# Patient Record
Sex: Male | Born: 1955 | Race: White | Hispanic: No | Marital: Married | Smoking: Former smoker
Health system: Southern US, Community
[De-identification: ages and names within clinical notes are randomized; demographics above are authoritative.]

## PROBLEM LIST (undated history)

## (undated) DIAGNOSIS — I219 Acute myocardial infarction, unspecified: Secondary | ICD-10-CM

## (undated) DIAGNOSIS — Z87442 Personal history of urinary calculi: Secondary | ICD-10-CM

## (undated) DIAGNOSIS — C801 Malignant (primary) neoplasm, unspecified: Secondary | ICD-10-CM

## (undated) DIAGNOSIS — R748 Abnormal levels of other serum enzymes: Secondary | ICD-10-CM

## (undated) DIAGNOSIS — K219 Gastro-esophageal reflux disease without esophagitis: Secondary | ICD-10-CM

## (undated) DIAGNOSIS — Z8719 Personal history of other diseases of the digestive system: Secondary | ICD-10-CM

## (undated) DIAGNOSIS — J449 Chronic obstructive pulmonary disease, unspecified: Secondary | ICD-10-CM

## (undated) DIAGNOSIS — E559 Vitamin D deficiency, unspecified: Secondary | ICD-10-CM

## (undated) DIAGNOSIS — I10 Essential (primary) hypertension: Secondary | ICD-10-CM

## (undated) DIAGNOSIS — E669 Obesity, unspecified: Secondary | ICD-10-CM

## (undated) DIAGNOSIS — F419 Anxiety disorder, unspecified: Secondary | ICD-10-CM

## (undated) HISTORY — PX: NO PAST SURGERIES: SHX2092

---
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
## 2003-11-09 ENCOUNTER — Ambulatory Visit (HOSPITAL_COMMUNITY): Admission: RE | Admit: 2003-11-09 | Discharge: 2003-11-09 | Payer: Self-pay | Admitting: Family Medicine

## 2005-09-04 ENCOUNTER — Emergency Department (HOSPITAL_COMMUNITY): Admission: EM | Admit: 2005-09-04 | Discharge: 2005-09-04 | Payer: Self-pay | Admitting: Emergency Medicine

## 2005-09-04 ENCOUNTER — Encounter: Payer: Self-pay | Admitting: Internal Medicine

## 2008-03-08 ENCOUNTER — Ambulatory Visit: Payer: Self-pay | Admitting: Cardiology

## 2008-06-05 ENCOUNTER — Emergency Department (HOSPITAL_COMMUNITY): Admission: EM | Admit: 2008-06-05 | Discharge: 2008-06-05 | Payer: Self-pay | Admitting: Emergency Medicine

## 2010-05-31 ENCOUNTER — Ambulatory Visit (HOSPITAL_COMMUNITY)
Admission: RE | Admit: 2010-05-31 | Discharge: 2010-05-31 | Disposition: A | Discharge status: Home / Self Care | Payer: BC Managed Care – PPO | Source: Ambulatory Visit | Attending: Family Medicine | Admitting: Family Medicine

## 2010-05-31 ENCOUNTER — Other Ambulatory Visit (HOSPITAL_COMMUNITY): Payer: Self-pay | Admitting: Family Medicine

## 2010-05-31 DIAGNOSIS — T148XXA Other injury of unspecified body region, initial encounter: Secondary | ICD-10-CM

## 2010-05-31 DIAGNOSIS — R0789 Other chest pain: Secondary | ICD-10-CM

## 2010-05-31 DIAGNOSIS — S20219A Contusion of unspecified front wall of thorax, initial encounter: Secondary | ICD-10-CM | POA: Insufficient documentation

## 2010-05-31 DIAGNOSIS — X58XXXA Exposure to other specified factors, initial encounter: Secondary | ICD-10-CM | POA: Insufficient documentation

## 2010-06-02 NOTE — Consult Note (Signed)
NAME:  Harry Riley, Harry Riley NO.:  [id number]   MEDICAL RECORD NO.:  [id number]          PATIENT TYPE:  EMS   LOCATION:  [facility]                         FACILITY:  [facility]   PHYSICIAN:  Marcelo Baldy, DDS, MDDATE OF BIRTH:  [birth year]   DATE OF CONSULTATION:  09/04/2005  DATE OF DISCHARGE:  09/04/2005                                   CONSULTATION   The patient presented to the emergency room complaining of pain and swelling  in his right anterior mandible, which has been present for approximately 3  days.   HISTORY OF PRESENT ILLNESS:  Patient is a 55 year old male who presented to  his primary care physician at [facility] this morning, Dr. Margo Aye, with the  same complaints.  Dr. Margo Aye obtained a CT scan of the face and basic labs and  called our office describing a facial abscess with thrombocytopenia, so we  recommended the patient be transferred down to [facility] for  evaluation.   PAST MEDICAL HISTORY:  Unremarkable.   PAST SURGICAL HISTORY:  None.   MEDICATIONS:  None.   SOCIAL HISTORY:  He takes no drugs, does not drink alcohol, and does not  smoke.   ALLERGIES:  PENICILLIN.   PHYSICAL EXAMINATION:  GENERAL:  The patient is awake, alert, and oriented  x3.  He has gross swelling of the anterior mandible, which is soft and  slightly erythematous and slightly warm to touch.  Intraoral exam revealed  multiple carious teeth, including teeth numbers 22 through 27, which are  severely decayed.  He has a right anterior mandibular vestibular fullness,  which is fluctuant and tender to palpation.  He has poor oral hygiene, no  gross lymphadenopathy.  VITAL SIGNS:  Temperature 98 degrees, pulse 110, respirations 20, blood  pressure 124/80.   The lab results from [facility] showed a white blood cell count of 13.1,  hemoglobin of 17.2, hematocrit of 48.3, and a platelet count of 69,000.  His  basic metabolic profile:  Sodium 133, potassium 4.2, chloride  99, bicarb 22,  glucose 108, BUN 9, creatinine 0.9, and calcium 9.5.  His CT scan findings  were that he had multiple dental caries.  He also had a finding of  periapical abscess involving the right lateral mandibular incisor, or tooth  #26.  The results are a soft tissue abscess measuring 1.7 x 0.8 cm  transversely and 1.8 cm in diameter.  There was also generalized  premandibular cellulitis.   ASSESSMENT/PLAN:  The patient is a 55 year old gentleman with a right  anterior mandibular odontogenic abscess.  The plan is to do an incision and  drainage and extraction of necessary teeth, if possible, with IV sedation in  the emergency room and to discharge the patient home on oral antibiotics,  and have the patient follow up in our clinic on Thursday.  The patient will  also be instructed to follow up with his primary care physician for further  workup of this thrombocytopenia.      Marcelo Baldy, DDS, MD  Electronically Signed     TGB/MEDQ  D:  09/04/2005  T:  09/05/2005  Job:  [id number]

## 2010-06-02 NOTE — Consult Note (Signed)
NAME:  Harry Riley, Harry Riley NO.:  [id number]   MEDICAL RECORD NO.:  [id number]          PATIENT TYPE:  EMS   LOCATION:  MAJO                         FACILITY:  [facility]   PHYSICIAN:  Marcelo Baldy, DDS, MDDATE OF BIRTH:  Mar 11, 1955   DATE OF CONSULTATION:  DATE OF DISCHARGE:  09/04/2005                                   CONSULTATION   PROCEDURE NOTE:   PROCEDURE:  Incision and drainage of right anterior mandibular and submental  space abscess.   PROCEDURE IN DETAIL:  After the patient was adequately sedated with  intravenous medications, approximately 4 mL of 2% lidocaine with 100,000 of  epinephrine was infiltrated locally in the right anterior mandibular  vestibule and submental areas.  A #11 blade was then used to create an  incision in the right anterior mandibular vestibule to enter the abscess  cavity.  Approximately 3 mL of purulent exudate was obtained.  Subperiosteal  dissection was carried out inferiorly to the inferior border of the mandible  laterally and medially.  The area was copiously irrigated.  A Penrose drain  was then placed and secured into position using a 3-0 nylon suture.  The  patient, at this point, was allowed to awaken from sedation.  Gauze pack was  placed for hemostasis and hemostasis was verified prior to discharging the  patient home.  The patient tolerated the procedure well.  The patient was  instructed to call the office tomorrow morning to schedule a followup visit  for drain removal and was prescribed clindamycin 150 mg p.o. q.i.d. for  seven days as well as Lortab 7.5 mg 1 tablet p.o. every four to six hours  p.r.n. pain.      Marcelo Baldy, DDS, MD  Electronically Signed     TGB/MEDQ  D:  09/05/2005  T:  09/06/2005  Job:  [id number]

## 2011-08-20 ENCOUNTER — Ambulatory Visit (HOSPITAL_COMMUNITY)
Admission: RE | Admit: 2011-08-20 | Discharge: 2011-08-20 | Disposition: A | Discharge status: Home / Self Care | Payer: BC Managed Care – PPO | Source: Ambulatory Visit | Attending: Family Medicine | Admitting: Family Medicine

## 2011-08-20 ENCOUNTER — Other Ambulatory Visit (HOSPITAL_COMMUNITY): Payer: Self-pay | Admitting: Family Medicine

## 2011-08-20 DIAGNOSIS — M25579 Pain in unspecified ankle and joints of unspecified foot: Secondary | ICD-10-CM | POA: Insufficient documentation

## 2011-08-20 DIAGNOSIS — T1490XA Injury, unspecified, initial encounter: Secondary | ICD-10-CM | POA: Insufficient documentation

## 2011-08-20 DIAGNOSIS — X58XXXA Exposure to other specified factors, initial encounter: Secondary | ICD-10-CM | POA: Insufficient documentation

## 2011-08-20 DIAGNOSIS — M79671 Pain in right foot: Secondary | ICD-10-CM

## 2011-11-19 ENCOUNTER — Other Ambulatory Visit (HOSPITAL_COMMUNITY): Payer: Self-pay | Admitting: Family Medicine

## 2011-11-19 ENCOUNTER — Ambulatory Visit (HOSPITAL_COMMUNITY)
Admission: RE | Admit: 2011-11-19 | Discharge: 2011-11-19 | Disposition: A | Discharge status: Home / Self Care | Payer: BC Managed Care – PPO | Source: Ambulatory Visit | Attending: Family Medicine | Admitting: Family Medicine

## 2011-11-19 DIAGNOSIS — M949 Disorder of cartilage, unspecified: Secondary | ICD-10-CM | POA: Insufficient documentation

## 2011-11-19 DIAGNOSIS — R52 Pain, unspecified: Secondary | ICD-10-CM | POA: Insufficient documentation

## 2011-11-19 DIAGNOSIS — M899 Disorder of bone, unspecified: Secondary | ICD-10-CM | POA: Insufficient documentation

## 2012-10-27 ENCOUNTER — Other Ambulatory Visit (HOSPITAL_COMMUNITY): Payer: Self-pay | Admitting: Family Medicine

## 2012-10-27 ENCOUNTER — Ambulatory Visit (HOSPITAL_COMMUNITY)
Admission: RE | Admit: 2012-10-27 | Discharge: 2012-10-27 | Disposition: A | Discharge status: Home / Self Care | Payer: BC Managed Care – PPO | Source: Ambulatory Visit | Attending: Family Medicine | Admitting: Family Medicine

## 2012-10-27 DIAGNOSIS — M545 Low back pain, unspecified: Secondary | ICD-10-CM | POA: Insufficient documentation

## 2012-10-27 DIAGNOSIS — M549 Dorsalgia, unspecified: Secondary | ICD-10-CM

## 2018-05-01 ENCOUNTER — Ambulatory Visit (INDEPENDENT_AMBULATORY_CARE_PROVIDER_SITE_OTHER): Payer: Self-pay | Admitting: Internal Medicine

## 2018-09-21 ENCOUNTER — Other Ambulatory Visit (INDEPENDENT_AMBULATORY_CARE_PROVIDER_SITE_OTHER): Payer: Self-pay | Admitting: Internal Medicine

## 2018-10-02 ENCOUNTER — Encounter (INDEPENDENT_AMBULATORY_CARE_PROVIDER_SITE_OTHER): Payer: Self-pay | Admitting: Internal Medicine

## 2018-10-02 ENCOUNTER — Other Ambulatory Visit: Payer: Self-pay

## 2018-10-02 ENCOUNTER — Ambulatory Visit (INDEPENDENT_AMBULATORY_CARE_PROVIDER_SITE_OTHER): Payer: PRIVATE HEALTH INSURANCE | Admitting: Internal Medicine

## 2018-10-02 VITALS — BP 150/80 | HR 72 | Ht 68.0 in | Wt 216.4 lb

## 2018-10-02 DIAGNOSIS — E66811 Obesity, class 1: Secondary | ICD-10-CM

## 2018-10-02 DIAGNOSIS — Z114 Encounter for screening for human immunodeficiency virus [HIV]: Secondary | ICD-10-CM | POA: Diagnosis not present

## 2018-10-02 DIAGNOSIS — J449 Chronic obstructive pulmonary disease, unspecified: Secondary | ICD-10-CM

## 2018-10-02 DIAGNOSIS — E559 Vitamin D deficiency, unspecified: Secondary | ICD-10-CM | POA: Insufficient documentation

## 2018-10-02 DIAGNOSIS — E669 Obesity, unspecified: Secondary | ICD-10-CM | POA: Diagnosis not present

## 2018-10-02 DIAGNOSIS — K219 Gastro-esophageal reflux disease without esophagitis: Secondary | ICD-10-CM

## 2018-10-02 DIAGNOSIS — R748 Abnormal levels of other serum enzymes: Secondary | ICD-10-CM

## 2018-10-02 HISTORY — DX: Chronic obstructive pulmonary disease, unspecified: J44.9

## 2018-10-02 HISTORY — DX: Vitamin D deficiency, unspecified: E55.9

## 2018-10-02 HISTORY — DX: Obesity, unspecified: E66.9

## 2018-10-02 HISTORY — DX: Gastro-esophageal reflux disease without esophagitis: K21.9

## 2018-10-02 HISTORY — DX: Obesity, class 1: E66.811

## 2018-10-02 HISTORY — DX: Abnormal levels of other serum enzymes: R74.8

## 2018-10-02 NOTE — Progress Notes (Signed)
   Wellness Office Visit  Subjective:  Patient ID: Harry Riley, male    DOB: 1955-09-22  Age: 63 y.o. MRN: [id number]  CC: This man comes in for follow-up of COPD, elevated liver enzymes, gastroesophageal reflux disease, obesity and vitamin D deficiency. HPI He is doing reasonably well.  On the last visit, I was concerned about elevated liver enzymes and if they are still elevated, I told him we would have to investigate this further. He continues with higher dose of vitamin D3 10,000 units daily and also with Protonix for his acid reflux disease.  He has no specific symptoms today.  Past Medical History:  Diagnosis Date  . COPD (chronic obstructive pulmonary disease) ([place]) 10/02/2018  . Elevated liver enzymes 10/02/2018  . GERD (gastroesophageal reflux disease) 10/02/2018  . Obesity (BMI 30.0-34.9) 10/02/2018  . Vitamin D deficiency disease 10/02/2018      No family history on file.  Social History   Social History Narrative  . Not on file     Current Meds  Medication Sig  . albuterol (VENTOLIN HFA) 108 (90 Base) MCG/ACT inhaler Inhale 2 puffs into the lungs 4 (four) times daily as needed.  . Cholecalciferol (VITAMIN D-3) 125 MCG (5000 UT) TABS Take 10,000 Units by mouth daily.  . pantoprazole (PROTONIX) 40 MG tablet TAKE 1 TABLET BY MOUTH EVERY DAY       Objective:   Today's Vitals: BP (!) 150/80   Pulse 72   Ht 5\' 8"  (1.727 m)   Wt 216 lb 6.4 oz (98.2 kg)   BMI 32.90 kg/m  Vitals with BMI 10/02/2018  Height 5\' 8"   Weight 216 lbs 6 oz  BMI 123XX123  Systolic Q000111Q  Diastolic 80  Pulse 72     Physical Exam   He looks systemically well.  Systolic blood pressure elevated today and we will need to keep an eye on this.  No other new physical findings and he is alert and orientated.    Assessment   1. Encounter for special screening examination for HIV   2. Vitamin D deficiency disease   3. Chronic obstructive pulmonary disease, unspecified COPD type ([place])    4. Obesity (BMI 30.0-34.9)   5. Gastroesophageal reflux disease without esophagitis   6. Elevated liver enzymes      Plan: 1. He will continue with all medications for his chronic conditions above. 2. Blood tests ordered as below. 3. Further recommendations will depend on blood results and I will have him follow-up with Sarah in about 3 months for an annual physical exam.  Tests ordered Orders Placed This Encounter  Procedures  . COMPLETE METABOLIC PANEL WITH GFR  . VITAMIN D 25 Hydroxy (Vit-D Deficiency, Fractures)  . HIV Antibody (routine testing w rflx)  . Hepatitis Acute Panel     Doree Albee, MD

## 2018-10-03 LAB — COMPLETE METABOLIC PANEL WITH GFR
AG Ratio: 1.7 (calc) (ref 1.0–2.5)
ALT: 57 U/L — ABNORMAL HIGH (ref 9–46)
AST: 49 U/L — ABNORMAL HIGH (ref 10–35)
Albumin: 4.3 g/dL (ref 3.6–5.1)
Alkaline phosphatase (APISO): 94 U/L (ref 35–144)
BUN: 10 mg/dL (ref 7–25)
CO2: 23 mmol/L (ref 20–32)
Calcium: 9.2 mg/dL (ref 8.6–10.3)
Chloride: 105 mmol/L (ref 98–110)
Creat: 1.07 mg/dL (ref 0.70–1.25)
GFR, Est African American: 85 mL/min/{1.73_m2} (ref >=60)
GFR, Est Non African American: 73 mL/min/{1.73_m2} (ref >=60)
Globulin: 2.6 g/dL (calc) (ref 1.9–3.7)
Glucose, Bld: 79 mg/dL (ref 65–99)
Potassium: 4.3 mmol/L (ref 3.5–5.3)
Sodium: 139 mmol/L (ref 135–146)
Total Bilirubin: 0.7 mg/dL (ref 0.2–1.2)
Total Protein: 6.9 g/dL (ref 6.1–8.1)

## 2018-10-03 LAB — HEPATITIS PANEL, ACUTE
Hep A IgM: NONREACTIVE
Hep B C IgM: NONREACTIVE
Hepatitis B Surface Ag: NONREACTIVE
Hepatitis C Ab: NONREACTIVE
SIGNAL TO CUT-OFF: 0.19 (ref <1.00)

## 2018-10-03 LAB — VITAMIN D 25 HYDROXY (VIT D DEFICIENCY, FRACTURES): Vit D, 25-Hydroxy: 42 ng/mL (ref 30–100)

## 2018-10-03 LAB — HIV ANTIBODY (ROUTINE TESTING W REFLEX): HIV 1&2 Ab, 4th Generation: NONREACTIVE

## 2018-12-04 ENCOUNTER — Encounter (INDEPENDENT_AMBULATORY_CARE_PROVIDER_SITE_OTHER): Payer: Self-pay | Admitting: Internal Medicine

## 2018-12-04 ENCOUNTER — Ambulatory Visit (INDEPENDENT_AMBULATORY_CARE_PROVIDER_SITE_OTHER): Payer: PRIVATE HEALTH INSURANCE | Admitting: Internal Medicine

## 2018-12-04 DIAGNOSIS — J019 Acute sinusitis, unspecified: Secondary | ICD-10-CM

## 2018-12-04 MED ORDER — PREDNISONE 20 MG PO TABS: 40.0000 mg | ORAL_TABLET | Freq: Every day | ORAL | 1 refills | Status: DC

## 2018-12-04 MED ORDER — LEVOFLOXACIN 500 MG PO TABS: 500.0000 mg | ORAL_TABLET | Freq: Every day | ORAL | 0 refills | Status: DC

## 2018-12-04 NOTE — Progress Notes (Signed)
Metrics: Intervention Frequency ACO  Documented Smoking Status Yearly  Screened one or more times in 24 months  Cessation Counseling or  Active cessation medication Past 24 months  Past 24 months   Guideline developer: UpToDate (See UpToDate for funding source) Date Released: 2014       Wellness Office Visit  Subjective:  Patient ID: Harry Riley, male    DOB: 1956/01/08  Age: 63 y.o. MRN: [id number]  CC: This is an audio telemedicine visit with the permission of the patient who is at home and I am in my office.  He was easily recognized on the phone by his voice. His chief complaint is sinus congestion. HPI He has had symptoms for the last 3 to 4 days with sinus congestion, postnasal drainage.  He has been sneezing significantly.  He denies any fever.  There is no significant cough.  There were no myalgias.  He denies any wheezing, dyspnea or chest congestion.  He has tried over-the-counter Mucinex without any benefit.  Past Medical History:  Diagnosis Date  . COPD (chronic obstructive pulmonary disease) ([place]) 10/02/2018  . Elevated liver enzymes 10/02/2018  . GERD (gastroesophageal reflux disease) 10/02/2018  . Obesity (BMI 30.0-34.9) 10/02/2018  . Vitamin D deficiency disease 10/02/2018      History reviewed. No pertinent family history.  Social History   Social History Narrative   Married for [employer].Retired from [employer].   Social History   Tobacco Use  . Smoking status: Former Smoker    Quit date: 01/16/2000    Years since quitting: 18.8  . Smokeless tobacco: Never Used  Substance Use Topics  . Alcohol use: Not Currently    Comment: History of alcoholism, quit drinking alcohol completely 1998.    Current Meds  Medication Sig  . albuterol (VENTOLIN HFA) 108 (90 Base) MCG/ACT inhaler Inhale 2 puffs into the lungs 4 (four) times daily as needed.  . Cholecalciferol (VITAMIN D-3) 125 MCG (5000 UT) TABS Take 10,000 Units by mouth daily.  . pantoprazole  (PROTONIX) 40 MG tablet TAKE 1 TABLET BY MOUTH EVERY DAY       Objective:   Today's Vitals: There were no vitals taken for this visit. Vitals with BMI 12/04/2018 10/02/2018  Height (No Data) 5\' 8"   Weight (No Data) 216 lbs 6 oz  BMI - 123XX123  Systolic (No Data) Q000111Q  Diastolic (No Data) 80  Pulse - 72     Physical Exam   His speech on the phone is normal.  He is alert and orientated.  When he palpated his maxillary areas, there was tenderness.    Assessment   1. Acute non-recurrent sinusitis, unspecified location       Tests ordered No orders of the defined types were placed in this encounter.    Plan: 1. I have sent a prescription for antibiotic and also prednisone as I think this is allergy based in origin also. 2. If he does not improve, he will let us know. 3. This phone call lasted 6 minutes.   Meds ordered this encounter  Medications  . levofloxacin (LEVAQUIN) 500 MG tablet    Sig: Take 1 tablet (500 mg total) by mouth daily.    Dispense:  7 tablet    Refill:  0  . predniSONE (DELTASONE) 20 MG tablet    Sig: Take 2 tablets (40 mg total) by mouth daily with breakfast.    Dispense:  10 tablet    Refill:  1    Nimish C  Anastasio Champion, MD

## 2018-12-24 ENCOUNTER — Encounter (INDEPENDENT_AMBULATORY_CARE_PROVIDER_SITE_OTHER): Payer: PRIVATE HEALTH INSURANCE | Admitting: Nurse Practitioner

## 2019-01-20 ENCOUNTER — Other Ambulatory Visit: Payer: Self-pay

## 2019-01-20 ENCOUNTER — Ambulatory Visit (INDEPENDENT_AMBULATORY_CARE_PROVIDER_SITE_OTHER): Payer: 59 | Admitting: Internal Medicine

## 2019-01-20 ENCOUNTER — Encounter (INDEPENDENT_AMBULATORY_CARE_PROVIDER_SITE_OTHER): Payer: Self-pay | Admitting: Internal Medicine

## 2019-01-20 VITALS — BP 140/80 | HR 88 | Temp 97.9°F | Resp 18 | Ht 68.0 in | Wt 227.0 lb

## 2019-01-20 DIAGNOSIS — J449 Chronic obstructive pulmonary disease, unspecified: Secondary | ICD-10-CM | POA: Diagnosis not present

## 2019-01-20 DIAGNOSIS — E559 Vitamin D deficiency, unspecified: Secondary | ICD-10-CM

## 2019-01-20 DIAGNOSIS — R748 Abnormal levels of other serum enzymes: Secondary | ICD-10-CM | POA: Diagnosis not present

## 2019-01-20 NOTE — Progress Notes (Signed)
Metrics: Intervention Frequency ACO  Documented Smoking Status Yearly  Screened one or more times in 24 months  Cessation Counseling or  Active cessation medication Past 24 months  Past 24 months   Guideline developer: UpToDate (See UpToDate for funding source) Date Released: 2014       Wellness Office Visit  Subjective:  Patient ID: Harry Riley, male    DOB: 1955-08-22  Age: 64 y.o. MRN: [id number]  CC: This man comes in for follow-up of COPD, vitamin D deficiency, gastroesophageal reflux disease and elevation in liver enzymes. HPI  He tells me that he has gained weight and he is not exercising as he used to.  He is afraid to go out and ride his bicycle because of COVID-19 pandemic. He continues on PPI for his gastroesophageal reflux disease. He continues on vitamin D3 supplementation for vitamin D deficiency. He continues on his inhalers as needed for COPD.  Thankfully, he is not oxygen dependent. Past Medical History:  Diagnosis Date  . COPD (chronic obstructive pulmonary disease) ([place]) 10/02/2018  . Elevated liver enzymes 10/02/2018  . GERD (gastroesophageal reflux disease) 10/02/2018  . Obesity (BMI 30.0-34.9) 10/02/2018  . Vitamin D deficiency disease 10/02/2018      History reviewed. No pertinent family history.  Social History   Social History Narrative   Married for [employer].Retired from [employer].   Social History   Tobacco Use  . Smoking status: Former Smoker    Quit date: 01/16/2000    Years since quitting: 19.0  . Smokeless tobacco: Never Used  Substance Use Topics  . Alcohol use: Not Currently    Comment: History of alcoholism, quit drinking alcohol completely 1998.    Current Meds  Medication Sig  . albuterol (VENTOLIN HFA) 108 (90 Base) MCG/ACT inhaler Inhale 2 puffs into the lungs 4 (four) times daily as needed.  . Cholecalciferol (VITAMIN D-3) 125 MCG (5000 UT) TABS Take 10,000 Units by mouth daily.      Objective:   Today's Vitals:  BP 140/80 (BP Location: Right Arm, Patient Position: Sitting, Cuff Size: Normal)   Pulse 88   Temp 97.9 F (36.6 C) (Temporal)   Resp 18   Ht '5\' 8"'  (1.727 m)   Wt 227 lb (103 kg)   SpO2 98% Comment: wear masking  BMI 34.52 kg/m  Vitals with BMI 01/20/2019 12/04/2018 10/02/2018  Height '5\' 8"'  (No Data) '5\' 8"'   Weight 227 lbs (No Data) 216 lbs 6 oz  BMI 76.73 - 41.93  Systolic 790 (No Data) 240  Diastolic 80 (No Data) 80  Pulse 88 - 72     Physical Exam    He looks systemically well.  He has gained about 9 pounds since last visit.  There is no respiratory distress.  He is alert and oriented.   Assessment   1. Vitamin D deficiency disease   2. Chronic obstructive pulmonary disease, unspecified COPD type ([place])   3. Elevated liver enzymes       Tests ordered Orders Placed This Encounter  Procedures  . CMP with eGFR([facility])     Plan: 1. Blood work is ordered above to reevaluate elevation in liver enzymes.  His hepatitis panel previously was negative. 2. He will continue with vitamin D3 supplementation as before. 3. He will continue with inhalers as before which I have reviewed today. 4. Follow-up in 3 months time with Judson Roch.   No orders of the defined types were placed in this encounter.   Doree Albee, MD

## 2019-01-21 LAB — COMPLETE METABOLIC PANEL WITH GFR
AG Ratio: 1.8 (calc) (ref 1.0–2.5)
ALT: 72 U/L — ABNORMAL HIGH (ref 9–46)
AST: 56 U/L — ABNORMAL HIGH (ref 10–35)
Albumin: 4.6 g/dL (ref 3.6–5.1)
Alkaline phosphatase (APISO): 97 U/L (ref 35–144)
BUN: 11 mg/dL (ref 7–25)
CO2: 24 mmol/L (ref 20–32)
Calcium: 9.2 mg/dL (ref 8.6–10.3)
Chloride: 106 mmol/L (ref 98–110)
Creat: 1.03 mg/dL (ref 0.70–1.25)
GFR, Est African American: 89 mL/min/{1.73_m2} (ref >=60)
GFR, Est Non African American: 76 mL/min/{1.73_m2} (ref >=60)
Globulin: 2.6 g/dL (calc) (ref 1.9–3.7)
Glucose, Bld: 79 mg/dL (ref 65–99)
Potassium: 4.4 mmol/L (ref 3.5–5.3)
Sodium: 140 mmol/L (ref 135–146)
Total Bilirubin: 0.6 mg/dL (ref 0.2–1.2)
Total Protein: 7.2 g/dL (ref 6.1–8.1)

## 2019-01-21 LAB — EXTRA LAV TOP TUBE

## 2019-01-23 ENCOUNTER — Other Ambulatory Visit (INDEPENDENT_AMBULATORY_CARE_PROVIDER_SITE_OTHER): Payer: Self-pay | Admitting: Internal Medicine

## 2019-01-23 DIAGNOSIS — R748 Abnormal levels of other serum enzymes: Secondary | ICD-10-CM

## 2019-01-26 NOTE — Progress Notes (Signed)
Patient called.  Gave instructions and results to wife;pt was driving. Told pt that his blood work showed that the liver enzyme tests are still abnormal.  Therefore, we need to arrange an ultrasound of the liver for him to look at the actual liver texture and make sure there were no abnormalities.  I will put the order in for him for ultrasound of the liver.

## 2019-01-26 NOTE — Progress Notes (Signed)
Called talk with wife will process order and appt to call back and give date to patient.

## 2019-01-27 NOTE — Progress Notes (Signed)
Return call to wife gave information on the result of lab work. Pt order faxed and schedule for imaging in days ahead.

## 2019-01-30 ENCOUNTER — Ambulatory Visit (HOSPITAL_COMMUNITY)
Admission: RE | Admit: 2019-01-30 | Discharge: 2019-01-30 | Disposition: A | Discharge status: Home / Self Care | Payer: 59 | Source: Ambulatory Visit | Attending: Internal Medicine | Admitting: Internal Medicine

## 2019-01-30 ENCOUNTER — Other Ambulatory Visit: Payer: Self-pay

## 2019-01-30 DIAGNOSIS — R748 Abnormal levels of other serum enzymes: Secondary | ICD-10-CM | POA: Insufficient documentation

## 2019-02-03 NOTE — Progress Notes (Signed)
Patient called.  Talked with wife and gave the results. Pt was by her side listening. Pt and spouse both understood the results. They will  talk to Dr Anastasio Champion when he comes back for follow up.

## 2019-02-10 ENCOUNTER — Other Ambulatory Visit (INDEPENDENT_AMBULATORY_CARE_PROVIDER_SITE_OTHER): Payer: Self-pay | Admitting: Internal Medicine

## 2019-02-14 ENCOUNTER — Other Ambulatory Visit (INDEPENDENT_AMBULATORY_CARE_PROVIDER_SITE_OTHER): Payer: Self-pay | Admitting: Internal Medicine

## 2019-04-29 ENCOUNTER — Other Ambulatory Visit: Payer: Self-pay

## 2019-04-29 ENCOUNTER — Encounter (INDEPENDENT_AMBULATORY_CARE_PROVIDER_SITE_OTHER): Payer: Self-pay | Admitting: Nurse Practitioner

## 2019-04-29 ENCOUNTER — Ambulatory Visit (INDEPENDENT_AMBULATORY_CARE_PROVIDER_SITE_OTHER): Payer: 59 | Admitting: Nurse Practitioner

## 2019-04-29 VITALS — BP 130/85 | HR 75 | Temp 98.9°F | Ht 70.5 in | Wt 225.8 lb

## 2019-04-29 DIAGNOSIS — E559 Vitamin D deficiency, unspecified: Secondary | ICD-10-CM

## 2019-04-29 DIAGNOSIS — J3089 Other allergic rhinitis: Secondary | ICD-10-CM | POA: Diagnosis not present

## 2019-04-29 DIAGNOSIS — J449 Chronic obstructive pulmonary disease, unspecified: Secondary | ICD-10-CM

## 2019-04-29 DIAGNOSIS — K760 Fatty (change of) liver, not elsewhere classified: Secondary | ICD-10-CM | POA: Diagnosis not present

## 2019-04-29 DIAGNOSIS — R748 Abnormal levels of other serum enzymes: Secondary | ICD-10-CM | POA: Diagnosis not present

## 2019-04-29 MED ORDER — AZELASTINE HCL 0.15 % NA SOLN: 1.0000 | Freq: Every day | NASAL | 1 refills | Status: DC

## 2019-04-29 MED ORDER — LEVOCETIRIZINE DIHYDROCHLORIDE 5 MG PO TABS
5.0000 mg | ORAL_TABLET | Freq: Every evening | ORAL | 0 refills | Status: DC
Start: 2019-04-29 — End: 2019-07-21

## 2019-04-29 NOTE — Progress Notes (Signed)
Subjective:  Patient ID: Harry Riley, male    DOB: [birth year]  Age: 64 y.o. MRN: [id number]  CC:  Chief Complaint  Patient presents with  . Follow-up    Hepatic steatosis, vitamin D deficiency  . Medication Refill    Levocetirizine 41m  at bedtime for allergies  . COPD      HPI  This patient comes in today for the above.  Hepatic steatosis: At his last visit it was ordered for him to undergo abdominal ultrasound for further evaluation of elevated AST and ALT.  The ultrasound did show probable hepatic steatosis.  Last blood work showed total bilirubin was normal, alkaline phosphatase was normal, hepatitis panel was negative.  He tells me he does not drink alcohol regularly.  He does admit to eating a fairly poor diet full of processed foods.  He has tried intermittent fasting in the past and has tolerated it fairly well.  Vitamin D deficiency: He has a history of vitamin D deficiency.  He continues on 10,000 IUs of vitamin D3 daily.  Last serum level was 42 and this was collected in September 2020.  Allergic rhinitis: He also has a history of allergic rhinitis.  He takes Xyzal nightly and has tolerated this well for quite some time.  He tells me he has tried Flonase nasal spray but he continues to have nasal congestion despite using this in the past.  He is wondering if there something else he can take.  COPD: He has a history of COPD and continues on albuterol as needed only.  He denies any exacerbations within the last year.  Per chart review I do see that he too has not had a flu or pneumonia vaccine, he tells me is not listed in having to consider at this time.  He also has not had the COVID-19 vaccine and is not interested in having this administered at this time.   Past Medical History:  Diagnosis Date  . COPD (chronic obstructive pulmonary disease) ([place] 10/02/2018  . Elevated liver enzymes 10/02/2018  . GERD (gastroesophageal reflux disease) 10/02/2018  . Obesity (BMI  30.0-34.9) 10/02/2018  . Vitamin D deficiency disease 10/02/2018      No family history on file.  Social History   Social History Narrative   Married for [employer]ired from [employer]   Social History   Tobacco Use  . Smoking status: Former Smoker    Quit date: 01/16/2000    Years since quitting: 19.2  . Smokeless tobacco: Never Used  Substance Use Topics  . Alcohol use: Not Currently    Comment: History of alcoholism, quit drinking alcohol completely 1998.     Current Meds  Medication Sig  . albuterol (VENTOLIN HFA) 108 (90 Base) MCG/ACT inhaler INHALE 2 PUFFS 4 TIMES A DAY AS NEEDED  . Cholecalciferol (VITAMIN D-3) 125 MCG (5000 UT) TABS Take 10,000 Units by mouth daily.  . pantoprazole (PROTONIX) 40 MG tablet TAKE 1 TABLET BY MOUTH EVERY DAY    ROS:  Review of Systems  Constitutional: Negative for fever, malaise/fatigue and weight loss.  Eyes: Negative for blurred vision and double vision.  Respiratory: Positive for shortness of breath. Negative for cough and wheezing.   Cardiovascular: Negative for chest pain and palpitations.     Objective:   Today's Vitals: BP 130/85 (BP Location: Left Arm, Patient Position: Sitting, Cuff Size: Normal)   Pulse 75   Temp 98.9 F (37.2 C) (Temporal)   Ht 5' 10.5" (1.791  m)   Wt 225 lb 12.8 oz (102.4 kg)   SpO2 92%   BMI 31.94 kg/m  Vitals with BMI 04/29/2019 01/20/2019 12/04/2018  Height 5' 10.5" 5' 8" (No Data)  Weight 225 lbs 13 oz 227 lbs (No Data)  BMI 13.08 65.78 -  Systolic 469 629 (No Data)  Diastolic 85 80 (No Data)  Pulse 75 88 -     Physical Exam Vitals reviewed.  Constitutional:      Appearance: Normal appearance.  HENT:     Head: Normocephalic and atraumatic.  Neck:     Vascular: No carotid bruit.  Cardiovascular:     Rate and Rhythm: Normal rate and regular rhythm.  Pulmonary:     Effort: Pulmonary effort is normal.     Breath sounds: Normal breath sounds.  Musculoskeletal:     Cervical  back: Neck supple.  Skin:    General: Skin is warm and dry.  Neurological:     Mental Status: He is alert and oriented to person, place, and time.  Psychiatric:        Mood and Affect: Mood normal.        Behavior: Behavior normal.        Thought Content: Thought content normal.        Judgment: Judgment normal.          Assessment and Plan   1. Non-seasonal allergic rhinitis, unspecified trigger   2. Elevated liver enzymes   3. Vitamin D deficiency disease   4. Hepatic steatosis   5. Chronic obstructive pulmonary disease, unspecified COPD type ([place])      Plan: 1.  I will prescribe Azelastine nasal spray that he can use every morning to see if this improves his symptoms further.  I did caution him that it is an antihistamine just like his allergy pill he takes in the evening.  We did discuss possibility of drowsiness, and that the first time he uses the spray he should try it in the morning and stay home for a few hours to see how it makes him feel.  I did let him know that if he experiences excessive drowsiness he should discontinue spray.  He tells me he understands.  2., 4.  We discussed the importance of weight loss, and I recommended that he again try to participate in intermittent fasting.  I did find this for him and his goal will be to start out by doing a 16-hour fast once a week.  First meal will be around 9:30 in the morning and last meal will be completed by 5:30 in the evening.  If he tolerates this then he will increase his fasting days from 1 day/week to 3 days/week.  I did also mention the importance of hydration, and if he were to feel unwell while fasting he should try to drink water and if feeling unwell persist for at least 10 minutes he should break his fast by eating a healthy snack.  I defined healthy foods as avoiding processed carbohydrates.  He tells me he will try to make these changes.  I will give him a handout with further information.  I will also check  blood work today including CBC, CMP, and check for immunity to hepatitis B.  I did discuss that if I find he does not have immunity to hepatitis B then I would recommend hepatitis B vaccine.  As he is hesitant to accept other vaccines I did ask him if you be willing to be  vaccinated for hepatitis B if needed, he tells me he would be willing to do this.  3.  He will continue on the same vitamin D supplement as prescribed for now.  I will collect vitamin D serum level today for further evaluation.  5.  Appears to be stable at this time.  I also encouraged him to get the flu and pneumonia vaccinations as well as a COVID-19 vaccine series, he tells me he will let me know if he changes his mind.  Tests ordered Orders Placed This Encounter  Procedures  . Hep B Surface Antibody  . Hep B Surface Antigen  . Vitamin D, 25-hydroxy  . CMP with eGFR([facility])  . CBC      Meds ordered this encounter  Medications  . levocetirizine (XYZAL) 5 MG tablet    Sig: Take 1 tablet (5 mg total) by mouth every evening.    Dispense:  90 tablet    Refill:  0    Order Specific Question:   Supervising Provider    Answer:   Hurshel Party C [[id number]]  . Azelastine HCl 0.15 % SOLN    Sig: Place 1 spray into the nose daily.    Dispense:  30 mL    Refill:  1    Order Specific Question:   Supervising Provider    Answer:   Doree Albee [[id number]]    Patient to follow-up in 3 months with Dr. Anastasio Champion or sooner if needed to have him complete the hepatitis B vaccination series.  Ailene Ards, NP

## 2019-04-29 NOTE — Patient Instructions (Addendum)
I have prescribed a new nasal spray for you that you can take to treat your allergies.  Spray once into each nostril only in the morning.  Because it can also cause drowsiness just like you are nightly allergy pill, if you experience excessive drowsiness when taking this spray discontinue the medication.  When you take the spray for the first time, please make sure that you do not have to drive anywhere and you can stay at home for a few hours to see how it makes you feel.  [facility] Dietary Recommendations for Weight Loss What to Avoid . Avoid added sugars o Often added sugar can be found in processed foods such as many condiments, dry cereals, cakes, cookies, chips, crisps, crackers, candies, sweetened drinks, etc.  o Read labels and AVOID/DECREASE use of foods with the following in their ingredient list: Sugar, fructose, high fructose corn syrup, sucrose, glucose, maltose, dextrose, molasses, cane sugar, brown sugar, any type of syrup, agave nectar, etc.   . Avoid snacking in between meals . Avoid foods made with flour o If you are going to eat food made with flour, choose those made with whole-grains; and, minimize your consumption as much as is tolerable . Avoid processed foods o These foods are generally stocked in the middle of the grocery store. Focus on shopping on the perimeter of the grocery.  . Avoid Meat  o We recommend following a plant-based diet at [facility]. Thus, we recommend avoiding meat as a general rule. Consider eating beans, legumes, eggs, and/or dairy products for regular protein sources o If you plan on eating meat limit to 4 ounces of meat at a time and choose lean options such as Fish, chicken, [place]. Avoid red meat intake such as pork and/or steak What to Include . Vegetables o GREEN LEAFY VEGETABLES: Kale, spinach, mustard greens, collard greens, cabbage, broccoli, etc. o OTHER: Asparagus, cauliflower, eggplant, carrots, peas, Brussel  sprouts, tomatoes, bell peppers, zucchini, beets, cucumbers, etc. . Grains, seeds, and legumes o Beans: kidney beans, black eyed peas, garbanzo beans, black beans, pinto beans, etc. o Whole, unrefined grains: brown rice, barley, bulgur, oatmeal, etc. . Healthy fats  o Avoid highly processed fats such as vegetable oil o Examples of healthy fats: avocado, olives, virgin olive oil, dark chocolate (?72% Cocoa), nuts (peanuts, almonds, walnuts, cashews, pecans, etc.) . None to Low Intake of Animal Sources of Protein o Meat sources: chicken, [place], salmon, tuna. Limit to 4 ounces of meat at one time. o Consider limiting dairy sources, but when choosing dairy focus on: PLAIN [place] yogurt, cottage cheese, high-protein milk . Fruit o Choose berries  When to Eat . Intermittent Fasting: o Choosing not to eat for a specific time period, but DO FOCUS ON HYDRATION when fasting o Multiple Techniques: - Time Restricted Eating: eat 3 meals in a day, each meal lasting no more than 60 minutes, no snacks between meals - 16-18 hour fast: fast for 16 to 18 hours up to 7 days a week. Often suggested to start with 2-3 nonconsecutive days per week.  . Remember the time you sleep is counted as fasting.  . Examples of eating schedule: Fast from 7:00pm-11:00am. Eat between 11:00am-7:00pm.  - 24-hour fast: fast for 24 hours up to every other day. Often suggested to start with 1 day per week . Remember the time you sleep is counted as fasting . Examples of eating schedule:  o Eating day: eat 2-3 meals on your eating day. If  doing 2 meals, each meal should last no more than 90 minutes. If doing 3 meals, each meal should last no more than 60 minutes. Finish last meal by 7:00pm. o Fasting day: Fast until 7:00pm.  o IF YOU FEEL UNWELL FOR ANY REASON/IN ANY WAY WHEN FASTING, STOP FASTING BY EATING A NUTRITIOUS SNACK OR LIGHT MEAL o ALWAYS FOCUS ON HYDRATION DURING FASTS - Acceptable Hydration sources: water, broths,  tea/coffee (black tea/coffee is best but using a small amount of whole-fat dairy products in coffee/tea is acceptable).  - Poor Hydration Sources: anything with sugar or artificial sweeteners added to it  These recommendations have been developed for patients that are actively receiving medical care from either Dr. Anastasio Champion or Jeralyn Ruths, DNP, NP-C at [facility]. These recommendations are developed for patients with specific medical conditions and are not meant to be distributed or used by others that are not actively receiving care from either provider listed above at [facility]. It is not appropriate to participate in the above eating plans without proper medical supervision.   Reference: Rexanne Mano. The obesity code. [place]; 2016.

## 2019-04-30 LAB — CBC
HCT: 48.3 % (ref 38.5–50.0)
Hemoglobin: 16.4 g/dL (ref 13.2–17.1)
MCH: 28.9 pg (ref 27.0–33.0)
MCHC: 34 g/dL (ref 32.0–36.0)
MCV: 85 fL (ref 80.0–100.0)
MPV: 13.4 fL — ABNORMAL HIGH (ref 7.5–12.5)
Platelets: 141 10*3/uL (ref 140–400)
RBC: 5.68 10*6/uL (ref 4.20–5.80)
RDW: 13.2 % (ref 11.0–15.0)
WBC: 7.7 10*3/uL (ref 3.8–10.8)

## 2019-04-30 LAB — COMPLETE METABOLIC PANEL WITH GFR
AG Ratio: 1.9 (calc) (ref 1.0–2.5)
ALT: 64 U/L — ABNORMAL HIGH (ref 9–46)
AST: 46 U/L — ABNORMAL HIGH (ref 10–35)
Albumin: 4.5 g/dL (ref 3.6–5.1)
Alkaline phosphatase (APISO): 110 U/L (ref 35–144)
BUN: 12 mg/dL (ref 7–25)
CO2: 25 mmol/L (ref 20–32)
Calcium: 9.3 mg/dL (ref 8.6–10.3)
Chloride: 106 mmol/L (ref 98–110)
Creat: 1.08 mg/dL (ref 0.70–1.25)
GFR, Est African American: 84 mL/min/{1.73_m2} (ref >=60)
GFR, Est Non African American: 72 mL/min/{1.73_m2} (ref >=60)
Globulin: 2.4 g/dL (calc) (ref 1.9–3.7)
Glucose, Bld: 88 mg/dL (ref 65–99)
Potassium: 4.3 mmol/L (ref 3.5–5.3)
Sodium: 138 mmol/L (ref 135–146)
Total Bilirubin: 0.6 mg/dL (ref 0.2–1.2)
Total Protein: 6.9 g/dL (ref 6.1–8.1)

## 2019-04-30 LAB — VITAMIN D 25 HYDROXY (VIT D DEFICIENCY, FRACTURES): Vit D, 25-Hydroxy: 50 ng/mL (ref 30–100)

## 2019-04-30 LAB — HEPATITIS B SURFACE ANTIBODY,QUALITATIVE: Hep B S Ab: NONREACTIVE

## 2019-04-30 LAB — HEPATITIS B SURFACE ANTIGEN: Hepatitis B Surface Ag: NONREACTIVE

## 2019-05-04 ENCOUNTER — Ambulatory Visit (INDEPENDENT_AMBULATORY_CARE_PROVIDER_SITE_OTHER): Payer: 59

## 2019-05-04 ENCOUNTER — Other Ambulatory Visit: Payer: Self-pay

## 2019-05-04 VITALS — HR 88 | Resp 18 | Ht 70.0 in | Wt 225.0 lb

## 2019-05-04 DIAGNOSIS — Z9229 Personal history of other drug therapy: Secondary | ICD-10-CM

## 2019-05-04 NOTE — Progress Notes (Signed)
Pt given Hep b injection for Rt arm. Pt tolerated well. No complaints.

## 2019-05-21 ENCOUNTER — Other Ambulatory Visit (INDEPENDENT_AMBULATORY_CARE_PROVIDER_SITE_OTHER): Payer: Self-pay | Admitting: Nurse Practitioner

## 2019-05-21 DIAGNOSIS — J3089 Other allergic rhinitis: Secondary | ICD-10-CM

## 2019-06-01 ENCOUNTER — Ambulatory Visit (INDEPENDENT_AMBULATORY_CARE_PROVIDER_SITE_OTHER): Payer: 59

## 2019-06-01 ENCOUNTER — Other Ambulatory Visit: Payer: Self-pay

## 2019-06-01 DIAGNOSIS — Z9229 Personal history of other drug therapy: Secondary | ICD-10-CM | POA: Diagnosis not present

## 2019-06-01 DIAGNOSIS — Z23 Encounter for immunization: Secondary | ICD-10-CM | POA: Diagnosis not present

## 2019-06-01 MED ORDER — HEPATITIS B VAC RECOMBINANT 5 MCG/0.5ML IJ SUSP
0.5000 mL | Freq: Once | INTRAMUSCULAR | Status: AC
  Administered 2019-06-01: 0.5 mL via INTRAMUSCULAR

## 2019-07-07 ENCOUNTER — Other Ambulatory Visit (INDEPENDENT_AMBULATORY_CARE_PROVIDER_SITE_OTHER): Payer: Self-pay | Admitting: Internal Medicine

## 2019-07-19 ENCOUNTER — Other Ambulatory Visit (INDEPENDENT_AMBULATORY_CARE_PROVIDER_SITE_OTHER): Payer: Self-pay | Admitting: Nurse Practitioner

## 2019-07-19 DIAGNOSIS — J3089 Other allergic rhinitis: Secondary | ICD-10-CM

## 2019-07-29 ENCOUNTER — Ambulatory Visit (INDEPENDENT_AMBULATORY_CARE_PROVIDER_SITE_OTHER): Payer: 59 | Admitting: Internal Medicine

## 2019-07-29 ENCOUNTER — Encounter (INDEPENDENT_AMBULATORY_CARE_PROVIDER_SITE_OTHER): Payer: Self-pay | Admitting: Internal Medicine

## 2019-07-29 ENCOUNTER — Other Ambulatory Visit: Payer: Self-pay

## 2019-07-29 VITALS — BP 130/90 | HR 78 | Temp 98.1°F | Ht 70.5 in | Wt 220.4 lb

## 2019-07-29 DIAGNOSIS — E559 Vitamin D deficiency, unspecified: Secondary | ICD-10-CM

## 2019-07-29 DIAGNOSIS — R748 Abnormal levels of other serum enzymes: Secondary | ICD-10-CM

## 2019-07-29 DIAGNOSIS — D0372 Melanoma in situ of left lower limb, including hip: Secondary | ICD-10-CM | POA: Diagnosis not present

## 2019-07-29 HISTORY — DX: Melanoma in situ of left lower limb, including hip: D03.72

## 2019-07-29 NOTE — Progress Notes (Signed)
Metrics: Intervention Frequency ACO  Documented Smoking Status Yearly  Screened one or more times in 24 months  Cessation Counseling or  Active cessation medication Past 24 months  Past 24 months   Guideline developer: UpToDate (See UpToDate for funding source) Date Released: 2014       Wellness Office Visit  Subjective:  Patient ID: Harry Riley, male    DOB: 02-21-1955  Age: 64 y.o. MRN: [id number]  CC: This man comes in for follow-up of elevated liver enzymes, fatty liver disease and vitamin D deficiency. 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  Apparently margins were clear according to the patient. He continues to take vitamin D3 supplementation for vitamin D deficiency. He has not followed through with following nutritional guidelines that were discussed with him with Judson Roch last time. Past Medical History:  Diagnosis Date  . COPD (chronic obstructive pulmonary disease) ([place]) 10/02/2018  . Elevated liver enzymes 10/02/2018  . GERD (gastroesophageal reflux disease) 10/02/2018  . Melanoma in situ of left lower extremity ([place]) 07/29/2019  . Obesity (BMI 30.0-34.9) 10/02/2018  . Vitamin D deficiency disease 10/02/2018   History reviewed. No pertinent surgical history.     Depression screen [facility] 2/9 04/29/2019  Decreased Interest 0  Down, Depressed, Hopeless 0  PHQ - 2 Score 0     Objective:   Today's Vitals: BP 130/90 (BP Location: Right Arm, Patient Position: Sitting, Cuff Size: Normal)   Pulse 78   Temp 98.1 F (36.7 C) (Temporal)   Ht 5' 10.5" (1.791 m)   Wt 220 lb 6.4 oz (100 kg)   SpO2 98%   BMI 31.18 kg/m  Vitals with BMI 07/29/2019 05/04/2019 04/29/2019  Height 5' 10.5" 5\' 10"  5' 10.5"  Weight 220 lbs 6 oz 225 lbs 225 lbs 13 oz  BMI 31.17 69.45 03.88  Systolic 828 - 003  Diastolic 90 - 85  Pulse 78 88 75     Physical Exam  He has lost about 5 pounds since the last visit.  Blood pressure is reasonable.  He is alert and  orientated without any focal neurological signs     Assessment   1. Vitamin D deficiency disease   2. Melanoma in situ of left lower extremity ([place])   3. Elevated liver enzymes       Tests ordered No orders of the defined types were placed in this encounter.    Plan: 1. He will continue with vitamin D3 supplementation for vitamin D deficiency. 2. I have urged him to follow nutritional guidelines which we discussed last time.  I stressed the importance of intermittent fasting and also following a healthier diet that we have discussed previously. 3. It appears that the margins of the melanoma on his left forearm are clear and I will defer this to Dr. Nevada Crane, dermatologist for follow-up. 4. I will see him in 2 months time for an annual physical exam and we will do blood work then.  Once again, I discussed COVID-19 vaccination and its importance and he said he will think about it   No orders of the defined types were placed in this encounter.   Doree Albee, MD

## 2019-09-29 ENCOUNTER — Encounter (INDEPENDENT_AMBULATORY_CARE_PROVIDER_SITE_OTHER): Payer: Self-pay | Admitting: Internal Medicine

## 2019-09-29 ENCOUNTER — Other Ambulatory Visit: Payer: Self-pay

## 2019-09-29 ENCOUNTER — Ambulatory Visit (INDEPENDENT_AMBULATORY_CARE_PROVIDER_SITE_OTHER): Payer: Medicare HMO | Admitting: Internal Medicine

## 2019-09-29 VITALS — BP 146/91 | HR 82 | Temp 97.9°F | Resp 19 | Ht 70.0 in | Wt 220.0 lb

## 2019-09-29 DIAGNOSIS — E559 Vitamin D deficiency, unspecified: Secondary | ICD-10-CM | POA: Diagnosis not present

## 2019-09-29 DIAGNOSIS — J449 Chronic obstructive pulmonary disease, unspecified: Secondary | ICD-10-CM | POA: Diagnosis not present

## 2019-09-29 DIAGNOSIS — E669 Obesity, unspecified: Secondary | ICD-10-CM | POA: Diagnosis not present

## 2019-09-29 DIAGNOSIS — R748 Abnormal levels of other serum enzymes: Secondary | ICD-10-CM

## 2019-09-29 LAB — COMPLETE METABOLIC PANEL WITH GFR
AG Ratio: 2 (calc) (ref 1.0–2.5)
ALT: 75 U/L — ABNORMAL HIGH (ref 9–46)
AST: 56 U/L — ABNORMAL HIGH (ref 10–35)
Albumin: 4.7 g/dL (ref 3.6–5.1)
Alkaline phosphatase (APISO): 117 U/L (ref 35–144)
BUN: 11 mg/dL (ref 7–25)
CO2: 25 mmol/L (ref 20–32)
Calcium: 9.2 mg/dL (ref 8.6–10.3)
Chloride: 106 mmol/L (ref 98–110)
Creat: 1.25 mg/dL (ref 0.70–1.25)
GFR, Est African American: 70 mL/min/{1.73_m2} (ref >=60)
GFR, Est Non African American: 60 mL/min/{1.73_m2} (ref >=60)
Globulin: 2.4 g/dL (calc) (ref 1.9–3.7)
Glucose, Bld: 87 mg/dL (ref 65–99)
Potassium: 4.3 mmol/L (ref 3.5–5.3)
Sodium: 141 mmol/L (ref 135–146)
Total Bilirubin: 0.7 mg/dL (ref 0.2–1.2)
Total Protein: 7.1 g/dL (ref 6.1–8.1)

## 2019-09-29 NOTE — Progress Notes (Signed)
Metrics: Intervention Frequency ACO  Documented Smoking Status Yearly  Screened one or more times in 24 months  Cessation Counseling or  Active cessation medication Past 24 months  Past 24 months   Guideline developer: UpToDate (See UpToDate for funding source) Date Released: 2014       Wellness Office Visit  Subjective:  Patient ID: Harry Riley, male    DOB: 1955-01-17  Age: 64 y.o. MRN: [id number]  CC: This man comes in for follow-up of elevated liver enzymes. HPI  Hepatitis B studies were negative and an ultrasound of the liver did show fatty liver disease. He has no specific complaints today. Past Medical History:  Diagnosis Date  . COPD (chronic obstructive pulmonary disease) ([place]) 10/02/2018  . Elevated liver enzymes 10/02/2018  . GERD (gastroesophageal reflux disease) 10/02/2018  . Melanoma in situ of left lower extremity ([place]) 07/29/2019  . Obesity (BMI 30.0-34.9) 10/02/2018  . Vitamin D deficiency disease 10/02/2018   History reviewed. No pertinent surgical history.   History reviewed. No pertinent family history.  Social History   Social History Narrative   Married for [employer].Retired from [employer].   Social History   Tobacco Use  . Smoking status: Former Smoker    Quit date: 01/16/2000    Years since quitting: 19.7  . Smokeless tobacco: Never Used  Substance Use Topics  . Alcohol use: Not Currently    Comment: History of alcoholism, quit drinking alcohol completely 1998.    Current Meds  Medication Sig  . albuterol (VENTOLIN HFA) 108 (90 Base) MCG/ACT inhaler INHALE 2 PUFFS BY MOUTH 4 TIMES A DAY AS NEEDED  . Azelastine HCl 0.15 % SOLN PLACE 1 SPRAY INTO THE NOSE DAILY.  Marland Kitchen Cholecalciferol (VITAMIN D-3) 125 MCG (5000 UT) TABS Take 10,000 Units by mouth daily.  Marland Kitchen levocetirizine (XYZAL) 5 MG tablet TAKE 1 TABLET BY MOUTH EVERY DAY IN THE EVENING      Depression screen [facility] 2/9 04/29/2019  Decreased Interest 0  Down, Depressed, Hopeless 0    PHQ - 2 Score 0     Objective:   Today's Vitals: BP (!) 146/91 (BP Location: Right Arm, Patient Position: Sitting, Cuff Size: Normal)   Pulse 82   Temp 97.9 F (36.6 C) (Temporal)   Resp 19   Ht 5\' 10"  (1.778 m)   Wt 220 lb (99.8 kg)   BMI 31.57 kg/m  Vitals with BMI 09/29/2019 07/29/2019 05/04/2019  Height 5\' 10"  5' 10.5" 5\' 10"   Weight 220 lbs 220 lbs 6 oz 225 lbs  BMI 31.57 29.92 42.68  Systolic 341 962 -  Diastolic 91 90 -  Pulse 82 78 88     Physical Exam   He remains obese.  He has not lost any weight.  Blood pressure slightly elevated today.    Assessment   1. Vitamin D deficiency disease   2. Elevated liver enzymes   3. Obesity (BMI 30.0-34.9)       Tests ordered Orders Placed This Encounter  Procedures  . COMPLETE METABOLIC PANEL WITH GFR     Plan: 1. We will check liver enzymes again today to make sure they are stable or improving. 2. I discussed with him COVID-19 vaccination once again.  I discussed this before in July and he still seems to be resistant. 3. Follow-up in 6 months with Judson Roch.   No orders of the defined types were placed in this encounter.   Doree Albee, MD

## 2019-09-29 NOTE — Progress Notes (Signed)
Pt has had skin cancer removed from left forearm.

## 2019-09-30 NOTE — Progress Notes (Signed)
Return call to Harry Riley and given lab results. Pt said he will do the best he can. He has due to income.

## 2019-09-30 NOTE — Progress Notes (Signed)
Please call this patient and tell him that his liver enzymes are getting worse.  He needs to focus on his diet that we have discussed many times before.Follow-up as scheduled.

## 2019-10-13 ENCOUNTER — Other Ambulatory Visit (INDEPENDENT_AMBULATORY_CARE_PROVIDER_SITE_OTHER): Payer: Self-pay | Admitting: Nurse Practitioner

## 2019-10-13 DIAGNOSIS — J3089 Other allergic rhinitis: Secondary | ICD-10-CM

## 2019-10-30 ENCOUNTER — Other Ambulatory Visit (INDEPENDENT_AMBULATORY_CARE_PROVIDER_SITE_OTHER): Payer: Self-pay | Admitting: Internal Medicine

## 2019-11-12 DIAGNOSIS — M5412 Radiculopathy, cervical region: Secondary | ICD-10-CM | POA: Diagnosis not present

## 2019-11-12 DIAGNOSIS — R0789 Other chest pain: Secondary | ICD-10-CM | POA: Diagnosis not present

## 2019-11-26 DIAGNOSIS — D225 Melanocytic nevi of trunk: Secondary | ICD-10-CM | POA: Diagnosis not present

## 2019-11-26 DIAGNOSIS — Z1283 Encounter for screening for malignant neoplasm of skin: Secondary | ICD-10-CM | POA: Diagnosis not present

## 2019-11-26 DIAGNOSIS — Z08 Encounter for follow-up examination after completed treatment for malignant neoplasm: Secondary | ICD-10-CM | POA: Diagnosis not present

## 2019-11-26 DIAGNOSIS — Z85820 Personal history of malignant melanoma of skin: Secondary | ICD-10-CM | POA: Diagnosis not present

## 2019-11-26 DIAGNOSIS — D485 Neoplasm of uncertain behavior of skin: Secondary | ICD-10-CM | POA: Diagnosis not present

## 2020-01-25 DIAGNOSIS — D2271 Melanocytic nevi of right lower limb, including hip: Secondary | ICD-10-CM | POA: Diagnosis not present

## 2020-01-25 DIAGNOSIS — Z08 Encounter for follow-up examination after completed treatment for malignant neoplasm: Secondary | ICD-10-CM | POA: Diagnosis not present

## 2020-01-25 DIAGNOSIS — Z85820 Personal history of malignant melanoma of skin: Secondary | ICD-10-CM | POA: Diagnosis not present

## 2020-01-25 DIAGNOSIS — L820 Inflamed seborrheic keratosis: Secondary | ICD-10-CM | POA: Diagnosis not present

## 2020-01-25 DIAGNOSIS — D485 Neoplasm of uncertain behavior of skin: Secondary | ICD-10-CM | POA: Diagnosis not present

## 2020-01-25 DIAGNOSIS — Z1283 Encounter for screening for malignant neoplasm of skin: Secondary | ICD-10-CM | POA: Diagnosis not present

## 2020-01-25 DIAGNOSIS — D225 Melanocytic nevi of trunk: Secondary | ICD-10-CM | POA: Diagnosis not present

## 2020-01-25 DIAGNOSIS — C44319 Basal cell carcinoma of skin of other parts of face: Secondary | ICD-10-CM | POA: Diagnosis not present

## 2020-01-26 ENCOUNTER — Other Ambulatory Visit (INDEPENDENT_AMBULATORY_CARE_PROVIDER_SITE_OTHER): Payer: Self-pay | Admitting: Internal Medicine

## 2020-03-10 ENCOUNTER — Other Ambulatory Visit (INDEPENDENT_AMBULATORY_CARE_PROVIDER_SITE_OTHER): Payer: Self-pay | Admitting: Internal Medicine

## 2020-03-10 DIAGNOSIS — J3089 Other allergic rhinitis: Secondary | ICD-10-CM

## 2020-03-14 DIAGNOSIS — Z85828 Personal history of other malignant neoplasm of skin: Secondary | ICD-10-CM | POA: Diagnosis not present

## 2020-03-14 DIAGNOSIS — Z85820 Personal history of malignant melanoma of skin: Secondary | ICD-10-CM | POA: Diagnosis not present

## 2020-03-14 DIAGNOSIS — D485 Neoplasm of uncertain behavior of skin: Secondary | ICD-10-CM | POA: Diagnosis not present

## 2020-03-14 DIAGNOSIS — Z08 Encounter for follow-up examination after completed treatment for malignant neoplasm: Secondary | ICD-10-CM | POA: Diagnosis not present

## 2020-03-28 ENCOUNTER — Ambulatory Visit (INDEPENDENT_AMBULATORY_CARE_PROVIDER_SITE_OTHER): Payer: Medicare HMO | Admitting: Nurse Practitioner

## 2020-04-03 ENCOUNTER — Other Ambulatory Visit (INDEPENDENT_AMBULATORY_CARE_PROVIDER_SITE_OTHER): Payer: Self-pay | Admitting: Internal Medicine

## 2020-04-04 ENCOUNTER — Other Ambulatory Visit: Payer: Self-pay

## 2020-04-04 ENCOUNTER — Telehealth (INDEPENDENT_AMBULATORY_CARE_PROVIDER_SITE_OTHER): Payer: Self-pay

## 2020-04-04 ENCOUNTER — Encounter (INDEPENDENT_AMBULATORY_CARE_PROVIDER_SITE_OTHER): Payer: Self-pay | Admitting: Nurse Practitioner

## 2020-04-04 ENCOUNTER — Ambulatory Visit (INDEPENDENT_AMBULATORY_CARE_PROVIDER_SITE_OTHER): Payer: Medicare HMO | Admitting: Nurse Practitioner

## 2020-04-04 VITALS — BP 148/84 | HR 79 | Temp 98.8°F | Ht 70.0 in | Wt 224.0 lb

## 2020-04-04 DIAGNOSIS — E559 Vitamin D deficiency, unspecified: Secondary | ICD-10-CM

## 2020-04-04 DIAGNOSIS — K760 Fatty (change of) liver, not elsewhere classified: Secondary | ICD-10-CM | POA: Diagnosis not present

## 2020-04-04 DIAGNOSIS — J449 Chronic obstructive pulmonary disease, unspecified: Secondary | ICD-10-CM | POA: Diagnosis not present

## 2020-04-04 MED ORDER — CYCLOBENZAPRINE HCL 10 MG PO TABS: 10.0000 mg | ORAL_TABLET | Freq: Two times a day (BID) | ORAL | 0 refills | Status: DC | PRN

## 2020-04-04 MED ORDER — SPIRIVA HANDIHALER 18 MCG IN CAPS: 18.0000 ug | ORAL_CAPSULE | Freq: Every day | RESPIRATORY_TRACT | 12 refills | Status: DC

## 2020-04-04 NOTE — Progress Notes (Signed)
Subjective:  Patient ID: Harry Riley, male    DOB: June 18, 1955  Age: 65 y.o. MRN: [id number]  CC:  Chief Complaint  Patient presents with  . Follow-up    Doing okay, no concerns  . COPD  . Other    Vitamin D deficiency, hepatic steatosis      HPI  This patient arrives today for the above.  COPD: He continues on albuterol as needed as well as Flonase nasal spray daily.  He tells me overall he feels okay however he has been needing to use albuterol little more often lately than before.  He tells me he also feels that he gets short of breath easier with activity than he used to.  Vitamin D deficiency: He continues on a vitamin D3 supplement.  Last serum check was approximately 11 months ago and it was 50.  Hepatic stereopsis: He has had elevated liver enzymes and he did undergo abdominal ultrasound which showed hepatic statuses.  Total bilirubin has remained normal, alkaline phosphatase is remain normal, he has had hepatitis panel checked and this was negative.  Past Medical History:  Diagnosis Date  . COPD (chronic obstructive pulmonary disease) ([place]) 10/02/2018  . Elevated liver enzymes 10/02/2018  . GERD (gastroesophageal reflux disease) 10/02/2018  . Melanoma in situ of left lower extremity ([place]) 07/29/2019  . Obesity (BMI 30.0-34.9) 10/02/2018  . Vitamin D deficiency disease 10/02/2018      History reviewed. No pertinent family history.  Social History   Social History Narrative   Married for [employer].Retired from [employer].   Social History   Tobacco Use  . Smoking status: Former Smoker    Quit date: 01/16/2000    Years since quitting: 20.2  . Smokeless tobacco: Never Used  Substance Use Topics  . Alcohol use: Not Currently    Comment: History of alcoholism, quit drinking alcohol completely 1998.     Current Meds  Medication Sig  . albuterol (VENTOLIN HFA) 108 (90 Base) MCG/ACT inhaler INHALE 2 PUFFS BY MOUTH 4 TIMES A DAY AS NEEDED  .  Azelastine HCl 0.15 % SOLN PLACE 1 SPRAY INTO THE NOSE DAILY.  Marland Kitchen Cholecalciferol (VITAMIN D-3) 125 MCG (5000 UT) TABS Take 5,000 Units by mouth daily.  . cyclobenzaprine (FLEXERIL) 10 MG tablet as needed.  . fluticasone (FLONASE) 50 MCG/ACT nasal spray Place 2 sprays into both nostrils daily.  Marland Kitchen loratadine-pseudoephedrine (CLARITIN-D 24-HOUR) 10-240 MG 24 hr tablet Take 1 tablet by mouth as needed for allergies.  . pantoprazole (PROTONIX) 40 MG tablet TAKE 1 TABLET BY MOUTH EVERY DAY  . tiotropium (SPIRIVA HANDIHALER) 18 MCG inhalation capsule Place 1 capsule (18 mcg total) into inhaler and inhale daily.  . vitamin B-12 (CYANOCOBALAMIN) 1000 MCG tablet Take 1,000 mcg by mouth daily.  . vitamin C (ASCORBIC ACID) 500 MG tablet Take 500 mg by mouth daily.    ROS:  Review of Systems  Respiratory: Positive for shortness of breath (especially with activity).   Cardiovascular: Negative for chest pain.  Gastrointestinal: Negative for abdominal pain.     Objective:   Today's Vitals: BP (!) 148/84   Pulse 79   Temp 98.8 F (37.1 C) (Temporal)   Ht '5\' 10"'  (1.778 m)   Wt 224 lb (101.6 kg)   SpO2 98%   BMI 32.14 kg/m  Vitals with BMI 04/04/2020 09/29/2019 07/29/2019  Height '5\' 10"'  '5\' 10"'  5' 10.5"  Weight 224 lbs 220 lbs 220 lbs 6 oz  BMI 32.14 31.57 31.17  Systolic 128 786 767  Diastolic 84 91 90  Pulse 79 82 78     Physical Exam Vitals reviewed.  Constitutional:      Appearance: Normal appearance.  HENT:     Head: Normocephalic and atraumatic.  Cardiovascular:     Rate and Rhythm: Normal rate and regular rhythm.  Pulmonary:     Effort: Pulmonary effort is normal.     Breath sounds: Normal breath sounds.  Musculoskeletal:     Cervical back: Neck supple.  Skin:    General: Skin is warm and dry.  Neurological:     Mental Status: He is alert and oriented to person, place, and time.  Psychiatric:        Mood and Affect: Mood normal.        Behavior: Behavior normal.         Thought Content: Thought content normal.        Judgment: Judgment normal.          Assessment and Plan   1. Chronic obstructive pulmonary disease, unspecified COPD type ([place])   2. Vitamin D deficiency disease   3. Hepatic steatosis      Plan: 1.  We will add Spiriva to his medication regimen. 2.  We will check a vitamin D serum level as well as CMP today. 3.  We did discuss results of his ultrasound and that recommendation currently would be to make some lifestyle changes aimed at helping him with weight loss.  We did discuss dietary recommendations as well as intermittent fasting.  I encouraged him to try a 16-hour fast 2-3 times a week, but we did discuss the importance of hydration during fasting and that if he were to feel unwell during fasting to break his fast.  He tells me he understands.   Tests ordered Orders Placed This Encounter  Procedures  . CMP with eGFR([facility])  . Vitamin D, 25-hydroxy      Meds ordered this encounter  Medications  . tiotropium (SPIRIVA HANDIHALER) 18 MCG inhalation capsule    Sig: Place 1 capsule (18 mcg total) into inhaler and inhale daily.    Dispense:  30 capsule    Refill:  12    Order Specific Question:   Supervising Provider    Answer:   Doree Albee [[id number]]    Patient to follow-up in 6 weeks or sooner as needed.  Ailene Ards, NP

## 2020-04-04 NOTE — Patient Instructions (Signed)
[facility] Dietary Recommendations for Weight Loss What to Avoid . Avoid added sugars o Often added sugar can be found in processed foods such as many condiments, dry cereals, cakes, cookies, chips, crisps, crackers, candies, sweetened drinks, etc.  o Read labels and AVOID/DECREASE use of foods with the following in their ingredient list: Sugar, fructose, high fructose corn syrup, sucrose, glucose, maltose, dextrose, molasses, cane sugar, brown sugar, any type of syrup, agave nectar, etc.   . Avoid snacking in between meals . Avoid foods made with flour o If you are going to eat food made with flour, choose those made with whole-grains; and, minimize your consumption as much as is tolerable . Avoid processed foods o These foods are generally stocked in the middle of the grocery store. Focus on shopping on the perimeter of the grocery.  . Avoid Meat  o We recommend following a plant-based diet at [facility]. Thus, we recommend avoiding meat as a general rule. Consider eating beans, legumes, eggs, and/or dairy products for regular protein sources o If you plan on eating meat limit to 4 ounces of meat at a time and choose lean options such as Fish, chicken, turkey. Avoid red meat intake such as pork and/or steak What to Include . Vegetables o GREEN LEAFY VEGETABLES: Kale, spinach, mustard greens, collard greens, cabbage, broccoli, etc. o OTHER: Asparagus, cauliflower, eggplant, carrots, peas, Brussel sprouts, tomatoes, bell peppers, zucchini, beets, cucumbers, etc. . Grains, seeds, and legumes o Beans: kidney beans, black eyed peas, garbanzo beans, black beans, pinto beans, etc. o Whole, unrefined grains: brown rice, barley, bulgur, oatmeal, etc. . Healthy fats  o Avoid highly processed fats such as vegetable oil o Examples of healthy fats: avocado, olives, virgin olive oil, dark chocolate (?72% Cocoa), nuts (peanuts, almonds, walnuts, cashews, pecans, etc.) . None to Low  Intake of Animal Sources of Protein o Meat sources: chicken, turkey, salmon, tuna. Limit to 4 ounces of meat at one time. o Consider limiting dairy sources, but when choosing dairy focus on: PLAIN [place] yogurt, cottage cheese, high-protein milk . Fruit o Choose berries  When to Eat . Intermittent Fasting: o Choosing not to eat for a specific time period, but DO FOCUS ON HYDRATION when fasting o Multiple Techniques: - Time Restricted Eating: eat 3 meals in a day, each meal lasting no more than 60 minutes, no snacks between meals - 16-18 hour fast: fast for 16 to 18 hours up to 7 days a week. Often suggested to start with 2-3 nonconsecutive days per week.  . Remember the time you sleep is counted as fasting.  . Examples of eating schedule: Fast from 7:00pm-11:00am. Eat between 11:00am-7:00pm.  - 24-hour fast: fast for 24 hours up to every other day. Often suggested to start with 1 day per week . Remember the time you sleep is counted as fasting . Examples of eating schedule:  o Eating day: eat 2-3 meals on your eating day. If doing 2 meals, each meal should last no more than 90 minutes. If doing 3 meals, each meal should last no more than 60 minutes. Finish last meal by 7:00pm. o Fasting day: Fast until 7:00pm.  o IF YOU FEEL UNWELL FOR ANY REASON/IN ANY WAY WHEN FASTING, STOP FASTING BY EATING A NUTRITIOUS SNACK OR LIGHT MEAL o ALWAYS FOCUS ON HYDRATION DURING FASTS - Acceptable Hydration sources: water, broths, tea/coffee (black tea/coffee is best but using a small amount of whole-fat dairy products in coffee/tea is acceptable).  -   Poor Hydration Sources: anything with sugar or artificial sweeteners added to it  These recommendations have been developed for patients that are actively receiving medical care from either Dr. [facility] or Danalee Flath, DNP, NP-C at [facility]. These recommendations are developed for patients with specific medical conditions and are not meant to be  distributed or used by others that are not actively receiving care from either provider listed above at [facility]. It is not appropriate to participate in the above eating plans without proper medical supervision.   Reference: Fung, J. The obesity code. [place]; 2016.   

## 2020-04-04 NOTE — Telephone Encounter (Signed)
Patient asked for a refill of the following medication:  cyclobenzaprine (FLEXERIL) 10 MG tablet  Last filled 11/12/2019  Patient stated that he takes this only as needed.

## 2020-04-05 ENCOUNTER — Encounter (INDEPENDENT_AMBULATORY_CARE_PROVIDER_SITE_OTHER): Payer: Self-pay | Admitting: Nurse Practitioner

## 2020-04-05 LAB — COMPLETE METABOLIC PANEL WITH GFR
AG Ratio: 1.6 (calc) (ref 1.0–2.5)
ALT: 64 U/L — ABNORMAL HIGH (ref 9–46)
AST: 48 U/L — ABNORMAL HIGH (ref 10–35)
Albumin: 4.4 g/dL (ref 3.6–5.1)
Alkaline phosphatase (APISO): 127 U/L (ref 35–144)
BUN: 10 mg/dL (ref 7–25)
CO2: 23 mmol/L (ref 20–32)
Calcium: 9.3 mg/dL (ref 8.6–10.3)
Chloride: 106 mmol/L (ref 98–110)
Creat: 0.97 mg/dL (ref 0.70–1.25)
GFR, Est African American: 95 mL/min/{1.73_m2} (ref >=60)
GFR, Est Non African American: 82 mL/min/{1.73_m2} (ref >=60)
Globulin: 2.7 g/dL (calc) (ref 1.9–3.7)
Glucose, Bld: 115 mg/dL (ref 65–139)
Potassium: 4.3 mmol/L (ref 3.5–5.3)
Sodium: 141 mmol/L (ref 135–146)
Total Bilirubin: 0.7 mg/dL (ref 0.2–1.2)
Total Protein: 7.1 g/dL (ref 6.1–8.1)

## 2020-04-05 LAB — VITAMIN D 25 HYDROXY (VIT D DEFICIENCY, FRACTURES): Vit D, 25-Hydroxy: 62 ng/mL (ref 30–100)

## 2020-05-04 DIAGNOSIS — D225 Melanocytic nevi of trunk: Secondary | ICD-10-CM | POA: Diagnosis not present

## 2020-05-04 DIAGNOSIS — Z08 Encounter for follow-up examination after completed treatment for malignant neoplasm: Secondary | ICD-10-CM | POA: Diagnosis not present

## 2020-05-04 DIAGNOSIS — D0462 Carcinoma in situ of skin of left upper limb, including shoulder: Secondary | ICD-10-CM | POA: Diagnosis not present

## 2020-05-04 DIAGNOSIS — Z85820 Personal history of malignant melanoma of skin: Secondary | ICD-10-CM | POA: Diagnosis not present

## 2020-05-04 DIAGNOSIS — D2272 Melanocytic nevi of left lower limb, including hip: Secondary | ICD-10-CM | POA: Diagnosis not present

## 2020-05-04 DIAGNOSIS — Z1283 Encounter for screening for malignant neoplasm of skin: Secondary | ICD-10-CM | POA: Diagnosis not present

## 2020-05-04 DIAGNOSIS — D485 Neoplasm of uncertain behavior of skin: Secondary | ICD-10-CM | POA: Diagnosis not present

## 2020-05-14 ENCOUNTER — Other Ambulatory Visit (INDEPENDENT_AMBULATORY_CARE_PROVIDER_SITE_OTHER): Payer: Self-pay | Admitting: Internal Medicine

## 2020-05-19 ENCOUNTER — Other Ambulatory Visit: Payer: Self-pay

## 2020-05-19 ENCOUNTER — Ambulatory Visit (INDEPENDENT_AMBULATORY_CARE_PROVIDER_SITE_OTHER): Payer: Medicare HMO | Admitting: Nurse Practitioner

## 2020-05-19 ENCOUNTER — Encounter (INDEPENDENT_AMBULATORY_CARE_PROVIDER_SITE_OTHER): Payer: Self-pay | Admitting: Nurse Practitioner

## 2020-05-19 VITALS — BP 148/84 | HR 81 | Temp 97.0°F | Ht 70.0 in | Wt 227.4 lb

## 2020-05-19 DIAGNOSIS — K760 Fatty (change of) liver, not elsewhere classified: Secondary | ICD-10-CM

## 2020-05-19 DIAGNOSIS — J449 Chronic obstructive pulmonary disease, unspecified: Secondary | ICD-10-CM | POA: Diagnosis not present

## 2020-05-19 DIAGNOSIS — E559 Vitamin D deficiency, unspecified: Secondary | ICD-10-CM

## 2020-05-19 NOTE — Patient Instructions (Signed)
Mediterranean Diet A Mediterranean diet refers to food and lifestyle choices that are based on the traditions of countries located on the [place]. This way of eating has been shown to help prevent certain conditions and improve outcomes for people who have chronic diseases, like kidney disease and heart disease. What are tips for following this plan? Lifestyle  Cook and eat meals together with your family, when possible.  Drink enough fluid to keep your urine clear or pale yellow.  Be physically active every day. This includes: ? Aerobic exercise like running or swimming. ? Leisure activities like gardening, walking, or housework.  Get 7-8 hours of sleep each night.  If recommended by your health care provider, drink red wine in moderation. This means 1 glass a day for nonpregnant women and 2 glasses a day for men. A glass of wine equals 5 oz (150 mL). Reading food labels  Check the serving size of packaged foods. For foods such as rice and pasta, the serving size refers to the amount of cooked product, not dry.  Check the total fat in packaged foods. Avoid foods that have saturated fat or trans fats.  Check the ingredients list for added sugars, such as corn syrup.   Shopping  At the grocery store, buy most of your food from the areas near the walls of the store. This includes: ? Fresh fruits and vegetables (produce). ? Grains, beans, nuts, and seeds. Some of these may be available in unpackaged forms or large amounts (in bulk). ? Fresh seafood. ? Poultry and eggs. ? Low-fat dairy products.  Buy whole ingredients instead of prepackaged foods.  Buy fresh fruits and vegetables in-season from local farmers markets.  Buy frozen fruits and vegetables in resealable bags.  If you do not have access to quality fresh seafood, buy precooked frozen shrimp or canned fish, such as tuna, salmon, or sardines.  Buy small amounts of raw or cooked vegetables, salads, or olives from  the deli or salad bar at your store.  Stock your pantry so you always have certain foods on hand, such as olive oil, canned tuna, canned tomatoes, rice, pasta, and beans. Cooking  Cook foods with extra-virgin olive oil instead of using butter or other vegetable oils.  Have meat as a side dish, and have vegetables or grains as your main dish. This means having meat in small portions or adding small amounts of meat to foods like pasta or stew.  Use beans or vegetables instead of meat in common dishes like chili or lasagna.  Experiment with different cooking methods. Try roasting or broiling vegetables instead of steaming or sauteing them.  Add frozen vegetables to soups, stews, pasta, or rice.  Add nuts or seeds for added healthy fat at each meal. You can add these to yogurt, salads, or vegetable dishes.  Marinate fish or vegetables using olive oil, lemon juice, garlic, and fresh herbs. Meal planning  Plan to eat 1 vegetarian meal one day each week. Try to work up to 2 vegetarian meals, if possible.  Eat seafood 2 or more times a week.  Have healthy snacks readily available, such as: ? Vegetable sticks with hummus. ? Greek yogurt. ? Fruit and nut trail mix.  Eat balanced meals throughout the week. This includes: ? Fruit: 2-3 servings a day ? Vegetables: 4-5 servings a day ? Low-fat dairy: 2 servings a day ? Fish, poultry, or lean meat: 1 serving a day ? Beans and legumes: 2 or more servings a week ?   Nuts and seeds: 1-2 servings a day ? Whole grains: 6-8 servings a day ? Extra-virgin olive oil: 3-4 servings a day  Limit red meat and sweets to only a few servings a month   What are my food choices?  Mediterranean diet ? Recommended  Grains: Whole-grain pasta. Brown rice. Bulgar wheat. Polenta. Couscous. Whole-wheat bread. [place].  Vegetables: Artichokes. Beets. Broccoli. Cabbage. Carrots. Eggplant. Green beans. Chard. Kale. Spinach. Onions. Leeks. Peas. Squash.  Tomatoes. Peppers. Radishes.  Fruits: Apples. Apricots. Avocado. Berries. Bananas. Cherries. Dates. Figs. Grapes. Lemons. Melon. Oranges. Peaches. Plums. Pomegranate.  Meats and other protein foods: Beans. Almonds. Sunflower seeds. Pine nuts. Peanuts. [place]. Salmon. Scallops. Shrimp. Parole. Tilapia. Clams. Oysters. Eggs.  Dairy: Low-fat milk. Cheese. Greek yogurt.  Beverages: Water. Red wine. Herbal tea.  Fats and oils: Extra virgin olive oil. Avocado oil. Grape seed oil.  Sweets and desserts: [place] yogurt with honey. Baked apples. Poached pears. Trail mix.  Seasoning and other foods: Basil. Cilantro. Coriander. Cumin. Mint. Parsley. Sage. Rosemary. Tarragon. Garlic. Oregano. Thyme. Pepper. Balsalmic vinegar. Tahini. Hummus. Tomato sauce. Olives. Mushrooms. ? Limit these  Grains: Prepackaged pasta or rice dishes. Prepackaged cereal with added sugar.  Vegetables: Deep fried potatoes (french fries).  Fruits: Fruit canned in syrup.  Meats and other protein foods: Beef. Pork. Lamb. Poultry with skin. Hot dogs. Bernie[place] Salines.  Dairy: Ice cream. Sour cream. Whole milk.  Beverages: Juice. Sugar-sweetened soft drinks. Beer. Liquor and spirits.  Fats and oils: Butter. Canola oil. Vegetable oil. Beef fat (tallow). Lard.  Sweets and desserts: Cookies. Cakes. Pies. Candy.  Seasoning and other foods: Mayonnaise. Premade sauces and marinades. The items listed may not be a complete list. Talk with your dietitian about what dietary choices are right for you. Summary  The Mediterranean diet includes both food and lifestyle choices.  Eat a variety of fresh fruits and vegetables, beans, nuts, seeds, and whole grains.  Limit the amount of red meat and sweets that you eat.  Talk with your health care provider about whether it is safe for you to drink red wine in moderation. This means 1 glass a day for nonpregnant women and 2 glasses a day for men. A glass of wine equals 5 oz (150 mL). This information  is not intended to replace advice given to you by your health care provider. Make sure you discuss any questions you have with your health care provider. Document Revised: 09/01/2015 Document Reviewed: 08/25/2015 Elsevier Patient Education  2020 [employer].      Why follow it? Research shows. . Those who follow the Mediterranean diet have a reduced risk of heart disease  . The diet is associated with a reduced incidence of Parkinson's and Alzheimer's diseases . People following the diet may have longer life expectancies and lower rates of chronic diseases  . The Dietary Guidelines for Americans recommends the Mediterranean diet as an eating plan to promote health and prevent disease  What Is the Mediterranean Diet?  . Healthy eating plan based on typical foods and recipes of Mediterranean-style cooking . The diet is primarily a plant based diet; these foods should make up a majority of meals   Starches - Plant based foods should make up a majority of meals - They are an important sources of vitamins, minerals, energy, antioxidants, and fiber - Choose whole grains, foods high in fiber and minimally processed items  - Typical grain sources include wheat, oats, barley, corn, brown rice, bulgar, farro, millet, polenta, couscous  - Various  types of beans include chickpeas, lentils, fava beans, black beans, white beans   Fruits  Veggies - Large quantities of antioxidant rich fruits & veggies; 6 or more servings  - Vegetables can be eaten raw or lightly drizzled with oil and cooked  - Vegetables common to the traditional Mediterranean Diet include: artichokes, arugula, beets, broccoli, brussel sprouts, cabbage, carrots, celery, collard greens, cucumbers, eggplant, kale, leeks, lemons, lettuce, mushrooms, okra, onions, peas, peppers, potatoes, pumpkin, radishes, rutabaga, shallots, spinach, sweet potatoes, turnips, zucchini - Fruits common to the Mediterranean Diet include: apples, apricots,  avocados, cherries, clementines, dates, figs, grapefruits, grapes, melons, nectarines, oranges, peaches, pears, pomegranates, strawberries, tangerines  Fats - Replace butter and margarine with healthy oils, such as olive oil, canola oil, and tahini  - Limit nuts to no more than a handful a day  - Nuts include walnuts, almonds, pecans, pistachios, pine nuts  - Limit or avoid candied, honey roasted or heavily salted nuts - Olives are central to the [employer] - can be eaten whole or used in a variety of dishes   Meats Protein - Limiting red meat: no more than a few times a month - When eating red meat: choose lean cuts and keep the portion to the size of deck of cards - Eggs: approx. 0 to 4 times a week  - Fish and lean poultry: at least 2 a week  - Healthy protein sources include, chicken, [place], lean beef, lamb - Increase intake of seafood such as tuna, salmon, trout, mackerel, shrimp, scallops - Avoid or limit high fat processed meats such as sausage and bacon  Dairy - Include moderate amounts of low fat dairy products  - Focus on healthy dairy such as fat free yogurt, skim milk, low or reduced fat cheese - Limit dairy products higher in fat such as whole or 2% milk, cheese, ice cream  Alcohol - Moderate amounts of red wine is ok  - No more than 5 oz daily for women (all ages) and men older than age 74  - No more than 10 oz of wine daily for men younger than 75  Other - Limit sweets and other desserts  - Use herbs and spices instead of salt to flavor foods  - Herbs and spices common to the traditional Mediterranean Diet include: basil, bay leaves, chives, cloves, cumin, fennel, garlic, lavender, marjoram, mint, oregano, parsley, pepper, rosemary, sage, savory, sumac, tarragon, thyme   It's not just a diet, it's a lifestyle:  . The Mediterranean diet includes lifestyle factors typical of those in the region  . Foods, drinks and meals are best eaten with others and savored . Daily  physical activity is important for overall good health . This could be strenuous exercise like running and aerobics . This could also be more leisurely activities such as walking, housework, yard-work, or taking the stairs . Moderation is the key; a balanced and healthy diet accommodates most foods and drinks . Consider portion sizes and frequency of consumption of certain foods   Meal Ideas & Options:  . Breakfast:  o Whole wheat toast or whole wheat English muffins with peanut butter & hard boiled egg o Steel cut oats topped with apples & cinnamon and skim milk  o Fresh fruit: banana, strawberries, melon, berries, peaches  o Smoothies: strawberries, bananas, greek yogurt, peanut butter o Low fat greek yogurt with blueberries and granola  o Egg white omelet with spinach and mushrooms o Breakfast couscous: whole wheat couscous, apricots, skim milk,  cranberries  . Sandwiches:  o Hummus and grilled vegetables (peppers, zucchini, squash) on whole wheat bread   o Grilled chicken on whole wheat pita with lettuce, tomatoes, cucumbers or tzatziki  o Tuna salad on whole wheat bread: tuna salad made with greek yogurt, olives, red peppers, capers, green onions o Garlic rosemary lamb pita: lamb sauted with garlic, rosemary, salt & pepper; add lettuce, cucumber, greek yogurt to pita - flavor with lemon juice and black pepper  . Seafood:  o Mediterranean grilled salmon, seasoned with garlic, basil, parsley, lemon juice and black pepper o Shrimp, lemon, and spinach whole-grain pasta salad made with low fat greek yogurt  o Seared scallops with lemon orzo  o Seared tuna steaks seasoned salt, pepper, coriander topped with tomato mixture of olives, tomatoes, olive oil, minced garlic, parsley, green onions and cappers  . Meats:  o Herbed greek chicken salad with kalamata olives, cucumber, feta  o Red bell peppers stuffed with spinach, bulgur, lean ground beef (or lentils) & topped with feta   o Kebabs:  skewers of chicken, tomatoes, onions, zucchini, squash  o [place] burgers: made with red onions, mint, dill, lemon juice, feta cheese topped with roasted red peppers . Vegetarian o Cucumber salad: cucumbers, artichoke hearts, celery, red onion, feta cheese, tossed in olive oil & lemon juice  o Hummus and whole grain pita points with a greek salad (lettuce, tomato, feta, olives, cucumbers, red onion) o Lentil soup with celery, carrots made with vegetable broth, garlic, salt and pepper  o Tabouli salad: parsley, bulgur, mint, scallions, cucumbers, tomato, radishes, lemon juice, olive oil, salt and pepper.

## 2020-05-19 NOTE — Progress Notes (Signed)
Subjective:  Patient ID: Harry Riley, male    DOB: March 07, 1955  Age: 65 y.o. MRN: [id number]  CC:  Chief Complaint  Patient presents with  . Follow-up    Having several places removed on skin cancer, in a lot of pain  . COPD  . Other    Hepatic steatosis, vitamin D deficiency      HPI  This patient arrives today for the above.  COPD: He continues on albuterol as needed.  We also added Spiriva.  Health maintenance times.  He feels that his breathing has improved.  He has not had any exacerbations requiring steroids or hospital visit in the last year.  He is not up-to-date with flu or pneumonia vaccines or COVID-19 vaccine, but he is not interested in having this administered at this time.  Hepatic steatosis: He does have evidence of fatty liver on ultrasound.  He is actively trying to lose weight.  He did want to discuss this today.  He tells me that he eats salads frequently as well as fruits, and is not sure why he is not losing weight.  He does tell me he uses a lot of ranch dressing on his salads and he does canned fruit including drinking the juice or syrup that the fruit is in.  He also reports eating potato chips on a regular basis.  He does have an at home exercise bike, its not clear if he is using this on a regular basis right now.  Vitamin D Deficiency: He continues on 5000 IUs of vitamin D3 daily.  Last serum check was collected about 6 weeks ago and it showed a level of 62.   Past Medical History:  Diagnosis Date  . COPD (chronic obstructive pulmonary disease) ([place]) 10/02/2018  . Elevated liver enzymes 10/02/2018  . GERD (gastroesophageal reflux disease) 10/02/2018  . Melanoma in situ of left lower extremity ([place]) 07/29/2019  . Obesity (BMI 30.0-34.9) 10/02/2018  . Vitamin D deficiency disease 10/02/2018      History reviewed. No pertinent family history.  Social History   Social History Narrative   Married for [employer].Retired from [employer].    Social History   Tobacco Use  . Smoking status: Former Smoker    Quit date: 01/16/2000    Years since quitting: 20.3  . Smokeless tobacco: Never Used  Substance Use Topics  . Alcohol use: Not Currently    Comment: History of alcoholism, quit drinking alcohol completely 1998.     Current Meds  Medication Sig  . albuterol (VENTOLIN HFA) 108 (90 Base) MCG/ACT inhaler INHALE 2 PUFFS BY MOUTH 4 TIMES A DAY AS NEEDED  . Azelastine HCl 0.15 % SOLN PLACE 1 SPRAY INTO THE NOSE DAILY.  Marland Kitchen Cholecalciferol (VITAMIN D-3) 125 MCG (5000 UT) TABS Take 5,000 Units by mouth daily.  . cyclobenzaprine (FLEXERIL) 10 MG tablet Take 1 tablet (10 mg total) by mouth 2 (two) times daily as needed.  . fluticasone (FLONASE) 50 MCG/ACT nasal spray Place 2 sprays into both nostrils daily.  Marland Kitchen levocetirizine (XYZAL) 5 MG tablet TAKE 1 TABLET BY MOUTH EVERY DAY IN THE EVENING  . loratadine-pseudoephedrine (CLARITIN-D 24-HOUR) 10-240 MG 24 hr tablet Take 1 tablet by mouth as needed for allergies.  . mupirocin ointment (BACTROBAN) 2 %   . pantoprazole (PROTONIX) 40 MG tablet TAKE 1 TABLET BY MOUTH EVERY DAY  . tiotropium (SPIRIVA HANDIHALER) 18 MCG inhalation capsule Place 1 capsule (18 mcg total) into inhaler and inhale daily.  Marland Kitchen  vitamin B-12 (CYANOCOBALAMIN) 1000 MCG tablet Take 1,000 mcg by mouth daily.  . vitamin C (ASCORBIC ACID) 500 MG tablet Take 500 mg by mouth daily.    ROS:  Review of Systems  Eyes: Negative for blurred vision.  Respiratory: Negative for shortness of breath.   Cardiovascular: Positive for chest pain.  Gastrointestinal: Positive for heartburn.  Neurological: Negative for dizziness and headaches.     Objective:   Today's Vitals: BP (!) 148/84   Pulse 81   Temp (!) 97 F (36.1 C) (Temporal)   Ht 5\' 10"  (1.778 m)   Wt 227 lb 6.4 oz (103.1 kg)   SpO2 97%   BMI 32.63 kg/m  Vitals with BMI 05/19/2020 04/04/2020 09/29/2019  Height 5\' 10"  5\' 10"  5\' 10"   Weight 227 lbs 6 oz 224 lbs 220  lbs  BMI 32.63 39.76 73.41  Systolic 937 902 409  Diastolic 84 84 91  Pulse 81 79 82     Physical Exam Vitals reviewed.  Constitutional:      Appearance: Normal appearance.  HENT:     Head: Normocephalic and atraumatic.  Cardiovascular:     Rate and Rhythm: Normal rate and regular rhythm.  Pulmonary:     Effort: Pulmonary effort is normal.     Breath sounds: Normal breath sounds.  Musculoskeletal:     Cervical back: Neck supple.  Skin:    General: Skin is warm and dry.  Neurological:     Mental Status: He is alert and oriented to person, place, and time.  Psychiatric:        Mood and Affect: Mood normal.        Behavior: Behavior normal.        Thought Content: Thought content normal.        Judgment: Judgment normal.          Assessment and Plan   1. Hepatic steatosis   2. Vitamin D deficiency disease   3. Chronic obstructive pulmonary disease, unspecified COPD type ([place])      Plan: 1.  We did discuss dietary changes and increasing physical activity as tolerated.  I recommended he try to reduce his intake of ranch and he may change to more of an oil-based salad dressing such as olive oil.  We also discussed not drinking the juice or syrup that the canned fruit comes in, and even considering changing to fresh fruit as were going into the summer season.  I also encouraged him to try to do early morning walk or use his exercise bike on a regular basis.  We also discussed Mediterranean diet and he was encouraged to look into this as well.  We will see if this can help him with some weight loss. 2.  She will continue on his vitamin D3 supplement. 3.  Breathing seems to be improved overall with addition of Spiriva.  He will continue on this inhaler as well as has albuterol as needed.   Of note, during review of systems he did mention he had an episode of chest pain couple of nights ago.  He tells me he thinks it was related to muscular pain because he was lifting a air  conditioner earlier in the day.  He tells me the pain has resolved and is no longer bothering him.  He has not been experiencing episodes of chest pain other than that one time.  He was encouraged to let me know if chest pain comes back especially if he experiences it during  physical exertion or if he experiences any shortness of breath, radiation of the pain, dizziness, or cold sweats.  He tells me he understands.   Tests ordered No orders of the defined types were placed in this encounter.     No orders of the defined types were placed in this encounter.   Patient to follow-up in 3 months or sooner as needed.  Ailene Ards, NP

## 2020-05-23 DIAGNOSIS — L98499 Non-pressure chronic ulcer of skin of other sites with unspecified severity: Secondary | ICD-10-CM | POA: Diagnosis not present

## 2020-05-23 DIAGNOSIS — D485 Neoplasm of uncertain behavior of skin: Secondary | ICD-10-CM | POA: Diagnosis not present

## 2020-06-20 DIAGNOSIS — J069 Acute upper respiratory infection, unspecified: Secondary | ICD-10-CM | POA: Diagnosis not present

## 2020-06-20 DIAGNOSIS — J349 Unspecified disorder of nose and nasal sinuses: Secondary | ICD-10-CM | POA: Diagnosis not present

## 2020-06-27 DIAGNOSIS — R051 Acute cough: Secondary | ICD-10-CM | POA: Diagnosis not present

## 2020-08-18 ENCOUNTER — Encounter (INDEPENDENT_AMBULATORY_CARE_PROVIDER_SITE_OTHER): Payer: Self-pay

## 2020-08-22 DIAGNOSIS — D485 Neoplasm of uncertain behavior of skin: Secondary | ICD-10-CM | POA: Diagnosis not present

## 2020-08-22 DIAGNOSIS — Z08 Encounter for follow-up examination after completed treatment for malignant neoplasm: Secondary | ICD-10-CM | POA: Diagnosis not present

## 2020-08-22 DIAGNOSIS — L570 Actinic keratosis: Secondary | ICD-10-CM | POA: Diagnosis not present

## 2020-08-22 DIAGNOSIS — Z1283 Encounter for screening for malignant neoplasm of skin: Secondary | ICD-10-CM | POA: Diagnosis not present

## 2020-08-22 DIAGNOSIS — Z85820 Personal history of malignant melanoma of skin: Secondary | ICD-10-CM | POA: Diagnosis not present

## 2020-08-22 DIAGNOSIS — X32XXXD Exposure to sunlight, subsequent encounter: Secondary | ICD-10-CM | POA: Diagnosis not present

## 2020-08-22 DIAGNOSIS — D225 Melanocytic nevi of trunk: Secondary | ICD-10-CM | POA: Diagnosis not present

## 2020-08-22 DIAGNOSIS — L820 Inflamed seborrheic keratosis: Secondary | ICD-10-CM | POA: Diagnosis not present

## 2020-08-28 ENCOUNTER — Other Ambulatory Visit (INDEPENDENT_AMBULATORY_CARE_PROVIDER_SITE_OTHER): Payer: Self-pay | Admitting: Nurse Practitioner

## 2020-08-30 ENCOUNTER — Ambulatory Visit (INDEPENDENT_AMBULATORY_CARE_PROVIDER_SITE_OTHER): Payer: Medicare HMO | Admitting: Internal Medicine

## 2020-09-05 ENCOUNTER — Telehealth (INDEPENDENT_AMBULATORY_CARE_PROVIDER_SITE_OTHER): Payer: Self-pay

## 2020-09-05 DIAGNOSIS — K219 Gastro-esophageal reflux disease without esophagitis: Secondary | ICD-10-CM

## 2020-09-05 MED ORDER — PANTOPRAZOLE SODIUM 40 MG PO TBEC
40.0000 mg | DELAYED_RELEASE_TABLET | Freq: Every day | ORAL | 0 refills | Status: DC
Start: 2020-09-05 — End: 2023-03-29

## 2020-09-05 NOTE — Telephone Encounter (Signed)
Patient needs a refill of the following medication:  pantoprazole (PROTONIX) 40 MG tablet  Last flled 05/14/2020, # 90 with 0 refills

## 2020-09-05 NOTE — Telephone Encounter (Signed)
Refill approved and sent to pharmacy. 

## 2020-09-12 DIAGNOSIS — D485 Neoplasm of uncertain behavior of skin: Secondary | ICD-10-CM | POA: Diagnosis not present

## 2020-09-12 DIAGNOSIS — L988 Other specified disorders of the skin and subcutaneous tissue: Secondary | ICD-10-CM | POA: Diagnosis not present

## 2020-09-22 DIAGNOSIS — Z87891 Personal history of nicotine dependence: Secondary | ICD-10-CM | POA: Diagnosis not present

## 2020-09-22 DIAGNOSIS — Z6831 Body mass index (BMI) 31.0-31.9, adult: Secondary | ICD-10-CM | POA: Diagnosis not present

## 2020-09-22 DIAGNOSIS — Z299 Encounter for prophylactic measures, unspecified: Secondary | ICD-10-CM | POA: Diagnosis not present

## 2020-09-22 DIAGNOSIS — J449 Chronic obstructive pulmonary disease, unspecified: Secondary | ICD-10-CM | POA: Diagnosis not present

## 2020-09-22 DIAGNOSIS — K219 Gastro-esophageal reflux disease without esophagitis: Secondary | ICD-10-CM | POA: Diagnosis not present

## 2020-10-18 DIAGNOSIS — B9689 Other specified bacterial agents as the cause of diseases classified elsewhere: Secondary | ICD-10-CM | POA: Diagnosis not present

## 2020-10-18 DIAGNOSIS — J329 Chronic sinusitis, unspecified: Secondary | ICD-10-CM | POA: Diagnosis not present

## 2020-10-30 DIAGNOSIS — R0789 Other chest pain: Secondary | ICD-10-CM | POA: Diagnosis not present

## 2020-10-30 DIAGNOSIS — M62838 Other muscle spasm: Secondary | ICD-10-CM | POA: Diagnosis not present

## 2020-11-01 DIAGNOSIS — Z6831 Body mass index (BMI) 31.0-31.9, adult: Secondary | ICD-10-CM | POA: Diagnosis not present

## 2020-11-01 DIAGNOSIS — Z1331 Encounter for screening for depression: Secondary | ICD-10-CM | POA: Diagnosis not present

## 2020-11-01 DIAGNOSIS — Z299 Encounter for prophylactic measures, unspecified: Secondary | ICD-10-CM | POA: Diagnosis not present

## 2020-11-01 DIAGNOSIS — Z125 Encounter for screening for malignant neoplasm of prostate: Secondary | ICD-10-CM | POA: Diagnosis not present

## 2020-11-01 DIAGNOSIS — E6609 Other obesity due to excess calories: Secondary | ICD-10-CM | POA: Diagnosis not present

## 2020-11-01 DIAGNOSIS — Z7189 Other specified counseling: Secondary | ICD-10-CM | POA: Diagnosis not present

## 2020-11-01 DIAGNOSIS — Z1339 Encounter for screening examination for other mental health and behavioral disorders: Secondary | ICD-10-CM | POA: Diagnosis not present

## 2020-11-01 DIAGNOSIS — Z0000 Encounter for general adult medical examination without abnormal findings: Secondary | ICD-10-CM | POA: Diagnosis not present

## 2020-11-29 DIAGNOSIS — Z6831 Body mass index (BMI) 31.0-31.9, adult: Secondary | ICD-10-CM | POA: Diagnosis not present

## 2020-11-29 DIAGNOSIS — Z299 Encounter for prophylactic measures, unspecified: Secondary | ICD-10-CM | POA: Diagnosis not present

## 2020-11-29 DIAGNOSIS — J449 Chronic obstructive pulmonary disease, unspecified: Secondary | ICD-10-CM | POA: Diagnosis not present

## 2020-11-29 DIAGNOSIS — K219 Gastro-esophageal reflux disease without esophagitis: Secondary | ICD-10-CM | POA: Diagnosis not present

## 2020-11-29 DIAGNOSIS — Z0000 Encounter for general adult medical examination without abnormal findings: Secondary | ICD-10-CM | POA: Diagnosis not present

## 2020-11-29 DIAGNOSIS — R350 Frequency of micturition: Secondary | ICD-10-CM | POA: Diagnosis not present

## 2020-12-14 DIAGNOSIS — Z713 Dietary counseling and surveillance: Secondary | ICD-10-CM | POA: Diagnosis not present

## 2020-12-14 DIAGNOSIS — Z6831 Body mass index (BMI) 31.0-31.9, adult: Secondary | ICD-10-CM | POA: Diagnosis not present

## 2020-12-14 DIAGNOSIS — I10 Essential (primary) hypertension: Secondary | ICD-10-CM | POA: Diagnosis not present

## 2020-12-14 DIAGNOSIS — R350 Frequency of micturition: Secondary | ICD-10-CM | POA: Diagnosis not present

## 2020-12-14 DIAGNOSIS — Z299 Encounter for prophylactic measures, unspecified: Secondary | ICD-10-CM | POA: Diagnosis not present

## 2020-12-29 DIAGNOSIS — L820 Inflamed seborrheic keratosis: Secondary | ICD-10-CM | POA: Diagnosis not present

## 2020-12-29 DIAGNOSIS — X32XXXD Exposure to sunlight, subsequent encounter: Secondary | ICD-10-CM | POA: Diagnosis not present

## 2020-12-29 DIAGNOSIS — Z1283 Encounter for screening for malignant neoplasm of skin: Secondary | ICD-10-CM | POA: Diagnosis not present

## 2020-12-29 DIAGNOSIS — L821 Other seborrheic keratosis: Secondary | ICD-10-CM | POA: Diagnosis not present

## 2020-12-29 DIAGNOSIS — C44519 Basal cell carcinoma of skin of other part of trunk: Secondary | ICD-10-CM | POA: Diagnosis not present

## 2020-12-29 DIAGNOSIS — D225 Melanocytic nevi of trunk: Secondary | ICD-10-CM | POA: Diagnosis not present

## 2020-12-29 DIAGNOSIS — Z85820 Personal history of malignant melanoma of skin: Secondary | ICD-10-CM | POA: Diagnosis not present

## 2020-12-29 DIAGNOSIS — L570 Actinic keratosis: Secondary | ICD-10-CM | POA: Diagnosis not present

## 2020-12-29 DIAGNOSIS — Z08 Encounter for follow-up examination after completed treatment for malignant neoplasm: Secondary | ICD-10-CM | POA: Diagnosis not present

## 2021-01-19 DIAGNOSIS — D485 Neoplasm of uncertain behavior of skin: Secondary | ICD-10-CM | POA: Diagnosis not present

## 2021-01-19 DIAGNOSIS — L905 Scar conditions and fibrosis of skin: Secondary | ICD-10-CM | POA: Diagnosis not present

## 2021-01-19 DIAGNOSIS — Z1283 Encounter for screening for malignant neoplasm of skin: Secondary | ICD-10-CM | POA: Diagnosis not present

## 2021-02-14 DIAGNOSIS — Z6831 Body mass index (BMI) 31.0-31.9, adult: Secondary | ICD-10-CM | POA: Diagnosis not present

## 2021-02-14 DIAGNOSIS — I10 Essential (primary) hypertension: Secondary | ICD-10-CM | POA: Diagnosis not present

## 2021-02-14 DIAGNOSIS — Z0000 Encounter for general adult medical examination without abnormal findings: Secondary | ICD-10-CM | POA: Diagnosis not present

## 2021-02-14 DIAGNOSIS — Z1339 Encounter for screening examination for other mental health and behavioral disorders: Secondary | ICD-10-CM | POA: Diagnosis not present

## 2021-02-14 DIAGNOSIS — Z125 Encounter for screening for malignant neoplasm of prostate: Secondary | ICD-10-CM | POA: Diagnosis not present

## 2021-02-14 DIAGNOSIS — Z299 Encounter for prophylactic measures, unspecified: Secondary | ICD-10-CM | POA: Diagnosis not present

## 2021-02-14 DIAGNOSIS — Z1331 Encounter for screening for depression: Secondary | ICD-10-CM | POA: Diagnosis not present

## 2021-02-14 DIAGNOSIS — F17210 Nicotine dependence, cigarettes, uncomplicated: Secondary | ICD-10-CM | POA: Diagnosis not present

## 2021-02-14 DIAGNOSIS — E7800 Pure hypercholesterolemia, unspecified: Secondary | ICD-10-CM | POA: Diagnosis not present

## 2021-02-14 DIAGNOSIS — R5383 Other fatigue: Secondary | ICD-10-CM | POA: Diagnosis not present

## 2021-02-14 DIAGNOSIS — Z7189 Other specified counseling: Secondary | ICD-10-CM | POA: Diagnosis not present

## 2021-02-14 DIAGNOSIS — Z79899 Other long term (current) drug therapy: Secondary | ICD-10-CM | POA: Diagnosis not present

## 2021-02-28 DIAGNOSIS — I10 Essential (primary) hypertension: Secondary | ICD-10-CM | POA: Diagnosis not present

## 2021-02-28 DIAGNOSIS — J449 Chronic obstructive pulmonary disease, unspecified: Secondary | ICD-10-CM | POA: Diagnosis not present

## 2021-02-28 DIAGNOSIS — Z2821 Immunization not carried out because of patient refusal: Secondary | ICD-10-CM | POA: Diagnosis not present

## 2021-02-28 DIAGNOSIS — F411 Generalized anxiety disorder: Secondary | ICD-10-CM | POA: Diagnosis not present

## 2021-02-28 DIAGNOSIS — Z299 Encounter for prophylactic measures, unspecified: Secondary | ICD-10-CM | POA: Diagnosis not present

## 2021-02-28 DIAGNOSIS — Z6831 Body mass index (BMI) 31.0-31.9, adult: Secondary | ICD-10-CM | POA: Diagnosis not present

## 2021-03-14 DIAGNOSIS — Z6831 Body mass index (BMI) 31.0-31.9, adult: Secondary | ICD-10-CM | POA: Diagnosis not present

## 2021-03-14 DIAGNOSIS — I10 Essential (primary) hypertension: Secondary | ICD-10-CM | POA: Diagnosis not present

## 2021-03-14 DIAGNOSIS — Z299 Encounter for prophylactic measures, unspecified: Secondary | ICD-10-CM | POA: Diagnosis not present

## 2021-03-14 DIAGNOSIS — Z713 Dietary counseling and surveillance: Secondary | ICD-10-CM | POA: Diagnosis not present

## 2021-03-14 DIAGNOSIS — Z789 Other specified health status: Secondary | ICD-10-CM | POA: Diagnosis not present

## 2021-03-20 DIAGNOSIS — Z08 Encounter for follow-up examination after completed treatment for malignant neoplasm: Secondary | ICD-10-CM | POA: Diagnosis not present

## 2021-03-20 DIAGNOSIS — Z85820 Personal history of malignant melanoma of skin: Secondary | ICD-10-CM | POA: Diagnosis not present

## 2021-03-20 DIAGNOSIS — D225 Melanocytic nevi of trunk: Secondary | ICD-10-CM | POA: Diagnosis not present

## 2021-03-20 DIAGNOSIS — Z1283 Encounter for screening for malignant neoplasm of skin: Secondary | ICD-10-CM | POA: Diagnosis not present

## 2021-03-20 DIAGNOSIS — D485 Neoplasm of uncertain behavior of skin: Secondary | ICD-10-CM | POA: Diagnosis not present

## 2021-03-28 DIAGNOSIS — K219 Gastro-esophageal reflux disease without esophagitis: Secondary | ICD-10-CM | POA: Diagnosis not present

## 2021-03-28 DIAGNOSIS — Z299 Encounter for prophylactic measures, unspecified: Secondary | ICD-10-CM | POA: Diagnosis not present

## 2021-03-28 DIAGNOSIS — F411 Generalized anxiety disorder: Secondary | ICD-10-CM | POA: Diagnosis not present

## 2021-03-28 DIAGNOSIS — Z6832 Body mass index (BMI) 32.0-32.9, adult: Secondary | ICD-10-CM | POA: Diagnosis not present

## 2021-03-28 DIAGNOSIS — R079 Chest pain, unspecified: Secondary | ICD-10-CM | POA: Diagnosis not present

## 2021-03-28 DIAGNOSIS — I10 Essential (primary) hypertension: Secondary | ICD-10-CM | POA: Diagnosis not present

## 2021-04-11 DIAGNOSIS — I10 Essential (primary) hypertension: Secondary | ICD-10-CM | POA: Diagnosis not present

## 2021-04-11 DIAGNOSIS — Z299 Encounter for prophylactic measures, unspecified: Secondary | ICD-10-CM | POA: Diagnosis not present

## 2021-04-11 DIAGNOSIS — F411 Generalized anxiety disorder: Secondary | ICD-10-CM | POA: Diagnosis not present

## 2021-04-11 DIAGNOSIS — J302 Other seasonal allergic rhinitis: Secondary | ICD-10-CM | POA: Diagnosis not present

## 2021-04-13 DIAGNOSIS — I10 Essential (primary) hypertension: Secondary | ICD-10-CM | POA: Diagnosis not present

## 2021-04-14 DIAGNOSIS — R002 Palpitations: Secondary | ICD-10-CM | POA: Diagnosis not present

## 2021-04-14 DIAGNOSIS — R079 Chest pain, unspecified: Secondary | ICD-10-CM | POA: Diagnosis not present

## 2021-04-18 DIAGNOSIS — R079 Chest pain, unspecified: Secondary | ICD-10-CM | POA: Diagnosis not present

## 2021-04-18 DIAGNOSIS — R062 Wheezing: Secondary | ICD-10-CM | POA: Diagnosis not present

## 2021-04-25 DIAGNOSIS — Z713 Dietary counseling and surveillance: Secondary | ICD-10-CM | POA: Diagnosis not present

## 2021-04-25 DIAGNOSIS — I10 Essential (primary) hypertension: Secondary | ICD-10-CM | POA: Diagnosis not present

## 2021-04-25 DIAGNOSIS — Z299 Encounter for prophylactic measures, unspecified: Secondary | ICD-10-CM | POA: Diagnosis not present

## 2021-04-25 DIAGNOSIS — E6609 Other obesity due to excess calories: Secondary | ICD-10-CM | POA: Diagnosis not present

## 2021-04-25 DIAGNOSIS — Z6831 Body mass index (BMI) 31.0-31.9, adult: Secondary | ICD-10-CM | POA: Diagnosis not present

## 2021-04-25 DIAGNOSIS — F411 Generalized anxiety disorder: Secondary | ICD-10-CM | POA: Diagnosis not present

## 2021-05-10 IMAGING — US US ABDOMEN LIMITED
1 series · 14 of 25 positions shown · non-contrast
Comparison: None.

CLINICAL DATA: Elevated liver enzymes

EXAM:
ULTRASOUND ABDOMEN LIMITED RIGHT UPPER QUADRANT

[Series 1: us abdomen limited · 14 of 60 slices shown]
[im 1/60]
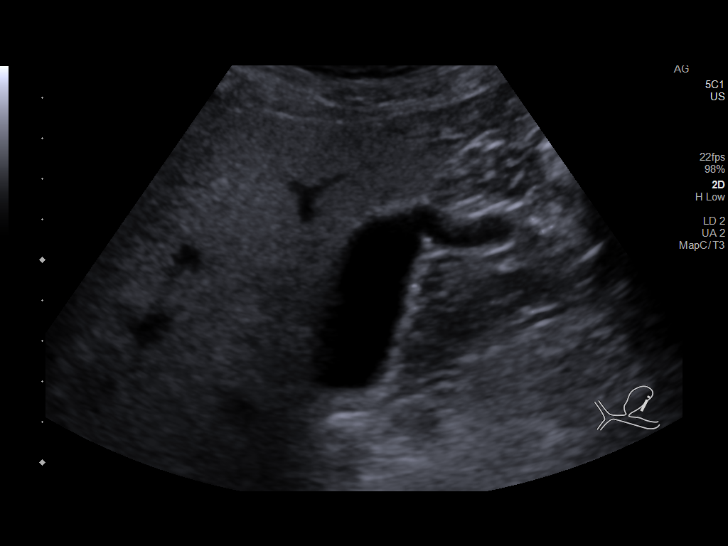
[im 5/60]
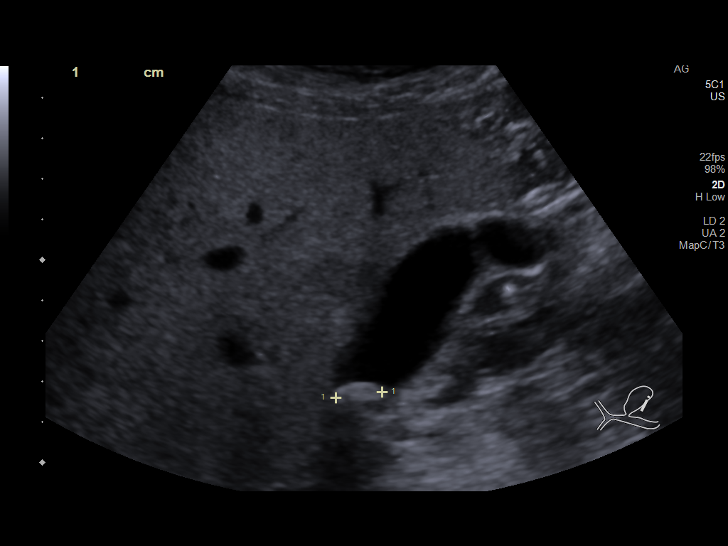
[im 10/60]
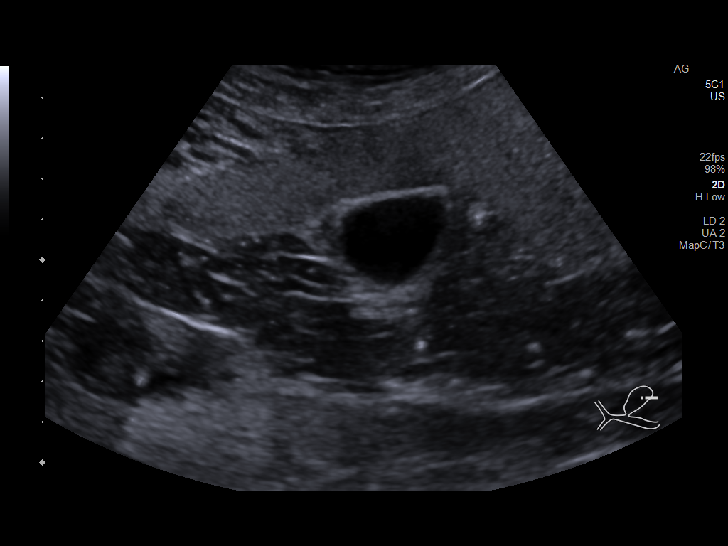
[im 15/60]
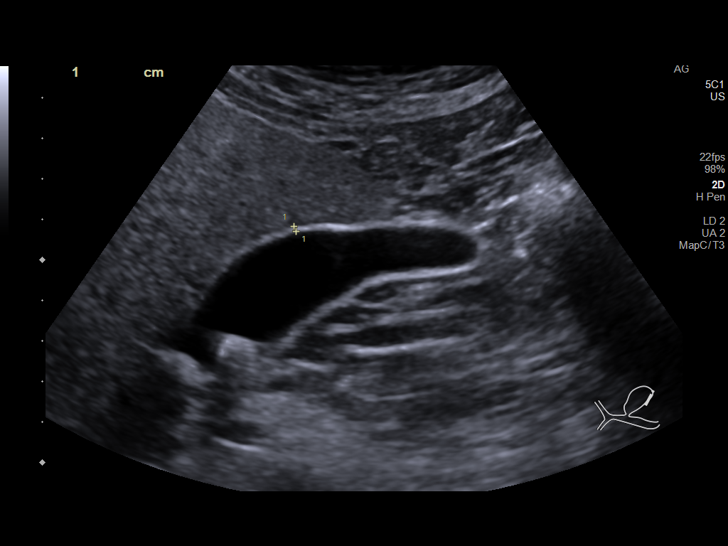
[im 20/60]
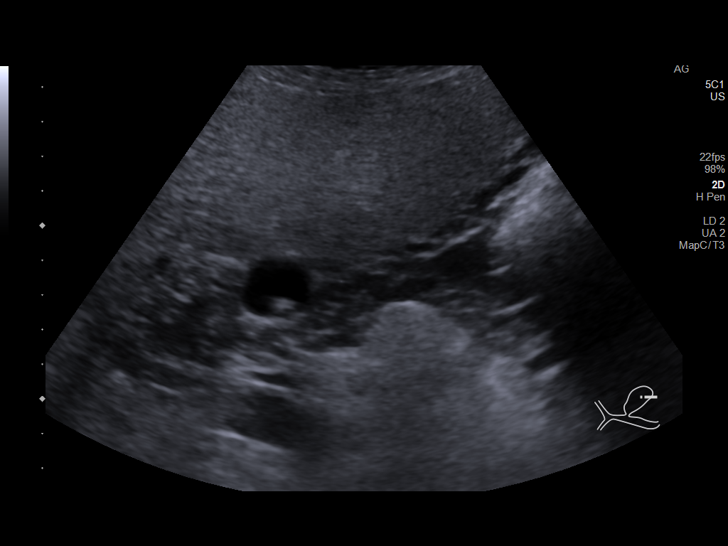
[im 23/60]
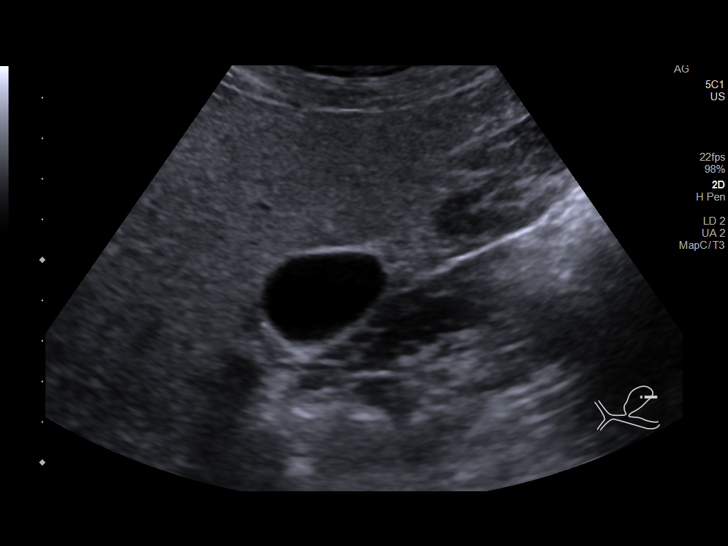
[im 28/60]
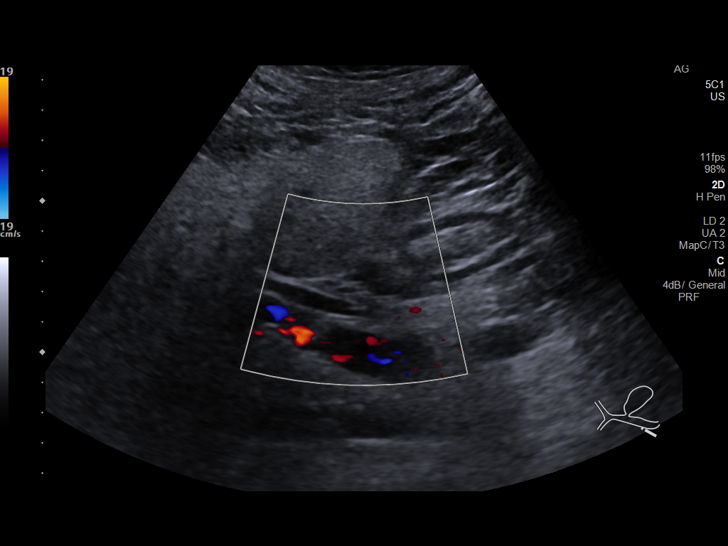
[im 32/60]
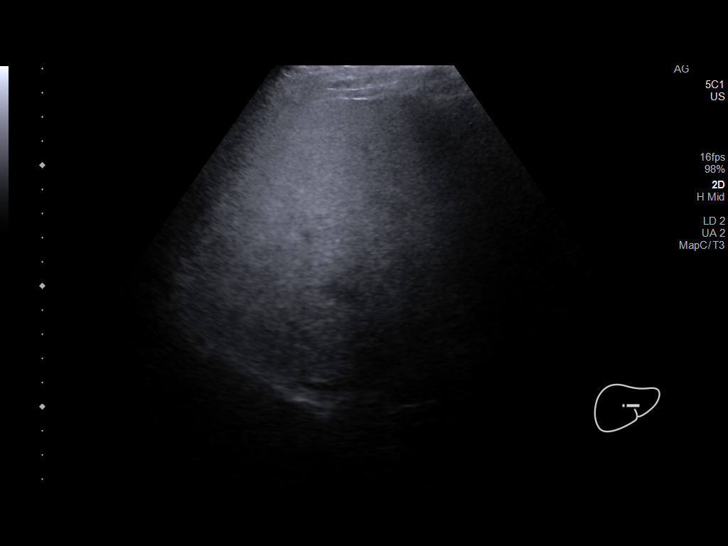
[im 37/60]
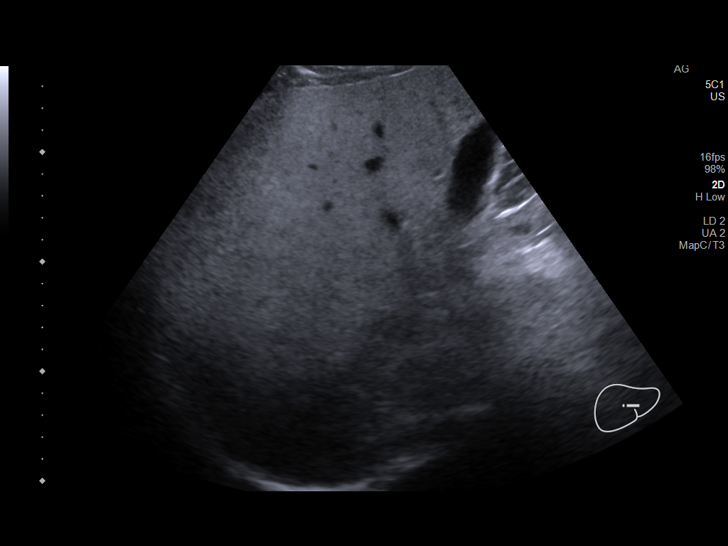
[im 40/60]
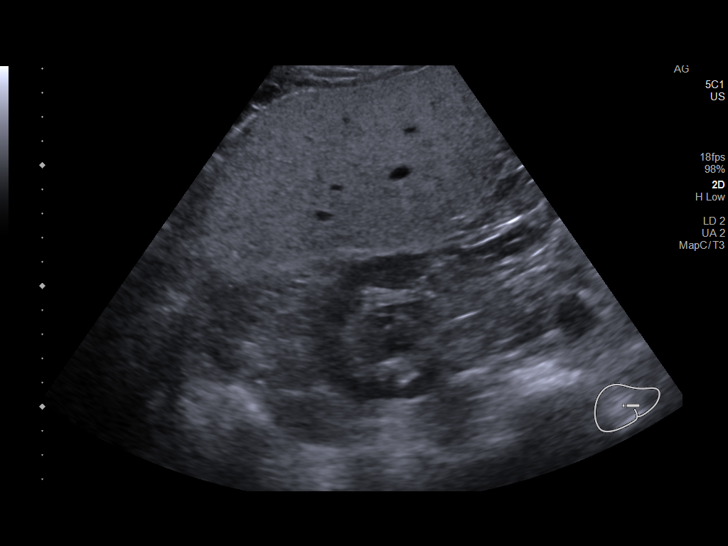
[im 45/60]
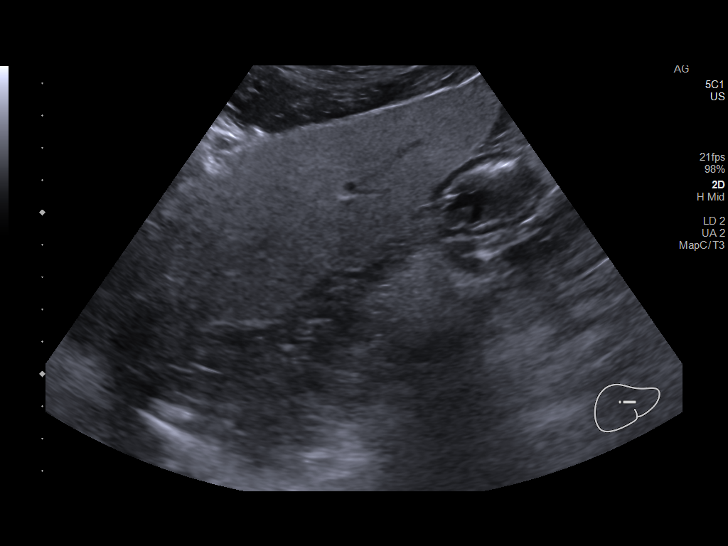
[im 50/60]
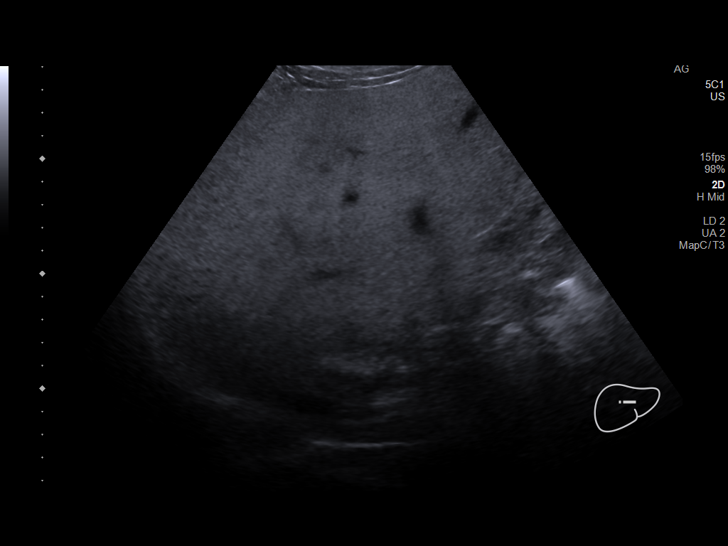
[im 55/60]
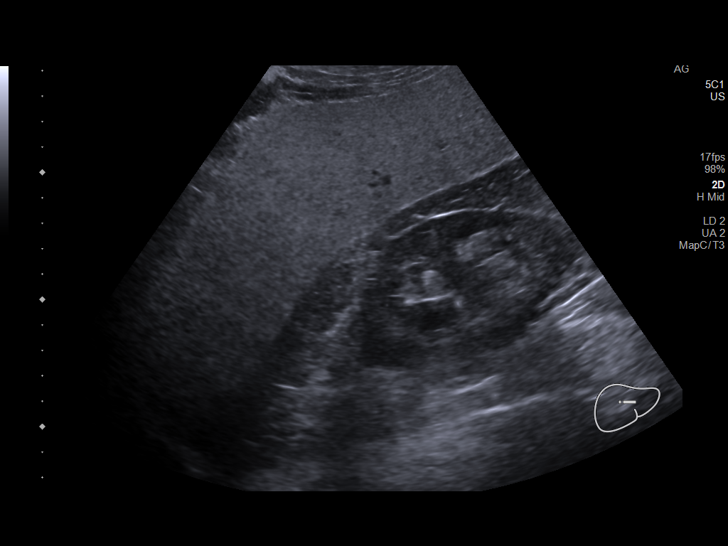
[im 60/60]
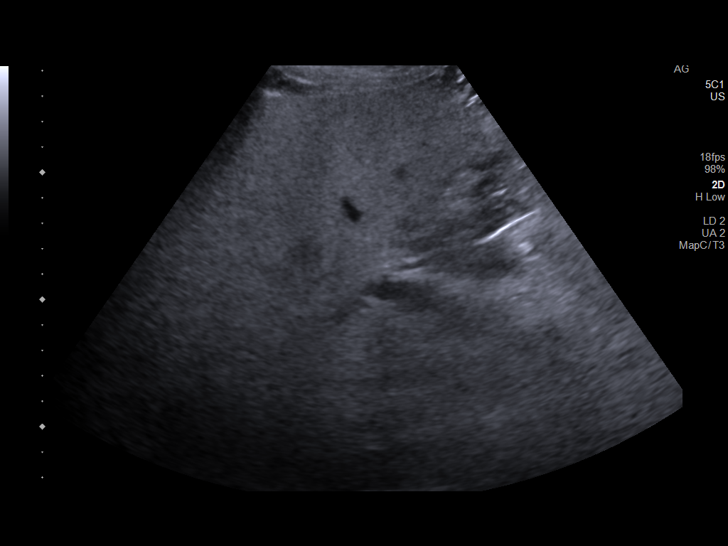

[14 of 25 positions shown; findings below may reference images not displayed]

FINDINGS: Gallbladder:

Within the gallbladder, there is a 1.2 cm in length echogenic focus
which moves and shadows consistent with cholelithiasis. No
gallbladder wall thickening or pericholecystic fluid. No sonographic
Murphy sign noted by sonographer.

Common bile duct:

Diameter: 4 mm. No intrahepatic or extrahepatic biliary duct
dilatation.

Liver:

No focal lesion identified. Liver echogenicity is increased
diffusely. Portal vein is patent on color Doppler imaging with
normal direction of blood flow towards the liver.

Other: None.
IMPRESSION: 1. Cholelithiasis. No gallbladder wall thickening or pericholecystic
fluid.

2. Diffuse increased liver echogenicity, a finding indicative of
hepatic steatosis. While no focal liver lesions are evident on this
study, it must be cautioned that the sensitivity of ultrasound for
detection of focal liver lesions is diminished in this circumstance.

## 2021-05-14 DIAGNOSIS — I10 Essential (primary) hypertension: Secondary | ICD-10-CM | POA: Diagnosis not present

## 2021-05-23 DIAGNOSIS — Z6832 Body mass index (BMI) 32.0-32.9, adult: Secondary | ICD-10-CM | POA: Diagnosis not present

## 2021-05-23 DIAGNOSIS — F411 Generalized anxiety disorder: Secondary | ICD-10-CM | POA: Diagnosis not present

## 2021-05-23 DIAGNOSIS — I10 Essential (primary) hypertension: Secondary | ICD-10-CM | POA: Diagnosis not present

## 2021-05-23 DIAGNOSIS — J302 Other seasonal allergic rhinitis: Secondary | ICD-10-CM | POA: Diagnosis not present

## 2021-05-23 DIAGNOSIS — Z299 Encounter for prophylactic measures, unspecified: Secondary | ICD-10-CM | POA: Diagnosis not present

## 2021-06-13 DIAGNOSIS — I10 Essential (primary) hypertension: Secondary | ICD-10-CM | POA: Diagnosis not present

## 2021-06-23 DIAGNOSIS — Z299 Encounter for prophylactic measures, unspecified: Secondary | ICD-10-CM | POA: Diagnosis not present

## 2021-06-23 DIAGNOSIS — Z6832 Body mass index (BMI) 32.0-32.9, adult: Secondary | ICD-10-CM | POA: Diagnosis not present

## 2021-06-23 DIAGNOSIS — J329 Chronic sinusitis, unspecified: Secondary | ICD-10-CM | POA: Diagnosis not present

## 2021-07-06 DIAGNOSIS — Z299 Encounter for prophylactic measures, unspecified: Secondary | ICD-10-CM | POA: Diagnosis not present

## 2021-07-06 DIAGNOSIS — I10 Essential (primary) hypertension: Secondary | ICD-10-CM | POA: Diagnosis not present

## 2021-07-06 DIAGNOSIS — Z6832 Body mass index (BMI) 32.0-32.9, adult: Secondary | ICD-10-CM | POA: Diagnosis not present

## 2021-07-06 DIAGNOSIS — J449 Chronic obstructive pulmonary disease, unspecified: Secondary | ICD-10-CM | POA: Diagnosis not present

## 2021-07-06 DIAGNOSIS — J3489 Other specified disorders of nose and nasal sinuses: Secondary | ICD-10-CM | POA: Diagnosis not present

## 2021-07-06 DIAGNOSIS — L219 Seborrheic dermatitis, unspecified: Secondary | ICD-10-CM | POA: Diagnosis not present

## 2021-07-10 DIAGNOSIS — D485 Neoplasm of uncertain behavior of skin: Secondary | ICD-10-CM | POA: Diagnosis not present

## 2021-07-10 DIAGNOSIS — Z1283 Encounter for screening for malignant neoplasm of skin: Secondary | ICD-10-CM | POA: Diagnosis not present

## 2021-07-10 DIAGNOSIS — Z08 Encounter for follow-up examination after completed treatment for malignant neoplasm: Secondary | ICD-10-CM | POA: Diagnosis not present

## 2021-07-10 DIAGNOSIS — D225 Melanocytic nevi of trunk: Secondary | ICD-10-CM | POA: Diagnosis not present

## 2021-07-10 DIAGNOSIS — Z85820 Personal history of malignant melanoma of skin: Secondary | ICD-10-CM | POA: Diagnosis not present

## 2021-07-10 DIAGNOSIS — C44212 Basal cell carcinoma of skin of right ear and external auricular canal: Secondary | ICD-10-CM | POA: Diagnosis not present

## 2021-07-10 DIAGNOSIS — X32XXXD Exposure to sunlight, subsequent encounter: Secondary | ICD-10-CM | POA: Diagnosis not present

## 2021-07-10 DIAGNOSIS — L570 Actinic keratosis: Secondary | ICD-10-CM | POA: Diagnosis not present

## 2021-07-13 DIAGNOSIS — I10 Essential (primary) hypertension: Secondary | ICD-10-CM | POA: Diagnosis not present

## 2021-07-20 DIAGNOSIS — I10 Essential (primary) hypertension: Secondary | ICD-10-CM | POA: Diagnosis not present

## 2021-07-20 DIAGNOSIS — Z6831 Body mass index (BMI) 31.0-31.9, adult: Secondary | ICD-10-CM | POA: Diagnosis not present

## 2021-07-20 DIAGNOSIS — Z713 Dietary counseling and surveillance: Secondary | ICD-10-CM | POA: Diagnosis not present

## 2021-07-20 DIAGNOSIS — Z299 Encounter for prophylactic measures, unspecified: Secondary | ICD-10-CM | POA: Diagnosis not present

## 2021-07-20 DIAGNOSIS — J449 Chronic obstructive pulmonary disease, unspecified: Secondary | ICD-10-CM | POA: Diagnosis not present

## 2021-08-09 DIAGNOSIS — I10 Essential (primary) hypertension: Secondary | ICD-10-CM | POA: Diagnosis not present

## 2021-08-09 DIAGNOSIS — Z713 Dietary counseling and surveillance: Secondary | ICD-10-CM | POA: Diagnosis not present

## 2021-08-09 DIAGNOSIS — L219 Seborrheic dermatitis, unspecified: Secondary | ICD-10-CM | POA: Diagnosis not present

## 2021-08-09 DIAGNOSIS — Z6832 Body mass index (BMI) 32.0-32.9, adult: Secondary | ICD-10-CM | POA: Diagnosis not present

## 2021-08-09 DIAGNOSIS — Z299 Encounter for prophylactic measures, unspecified: Secondary | ICD-10-CM | POA: Diagnosis not present

## 2021-08-14 DIAGNOSIS — I10 Essential (primary) hypertension: Secondary | ICD-10-CM | POA: Diagnosis not present

## 2021-08-21 DIAGNOSIS — Z85828 Personal history of other malignant neoplasm of skin: Secondary | ICD-10-CM | POA: Diagnosis not present

## 2021-08-21 DIAGNOSIS — Z08 Encounter for follow-up examination after completed treatment for malignant neoplasm: Secondary | ICD-10-CM | POA: Diagnosis not present

## 2021-09-04 DIAGNOSIS — I10 Essential (primary) hypertension: Secondary | ICD-10-CM | POA: Diagnosis not present

## 2021-09-04 DIAGNOSIS — Z713 Dietary counseling and surveillance: Secondary | ICD-10-CM | POA: Diagnosis not present

## 2021-09-04 DIAGNOSIS — Z299 Encounter for prophylactic measures, unspecified: Secondary | ICD-10-CM | POA: Diagnosis not present

## 2021-09-04 DIAGNOSIS — Z6831 Body mass index (BMI) 31.0-31.9, adult: Secondary | ICD-10-CM | POA: Diagnosis not present

## 2021-09-04 DIAGNOSIS — K5792 Diverticulitis of intestine, part unspecified, without perforation or abscess without bleeding: Secondary | ICD-10-CM | POA: Diagnosis not present

## 2021-09-13 DIAGNOSIS — K219 Gastro-esophageal reflux disease without esophagitis: Secondary | ICD-10-CM | POA: Diagnosis not present

## 2021-09-13 DIAGNOSIS — Z299 Encounter for prophylactic measures, unspecified: Secondary | ICD-10-CM | POA: Diagnosis not present

## 2021-09-13 DIAGNOSIS — K5792 Diverticulitis of intestine, part unspecified, without perforation or abscess without bleeding: Secondary | ICD-10-CM | POA: Diagnosis not present

## 2021-09-13 DIAGNOSIS — I10 Essential (primary) hypertension: Secondary | ICD-10-CM | POA: Diagnosis not present

## 2021-10-13 DIAGNOSIS — I10 Essential (primary) hypertension: Secondary | ICD-10-CM | POA: Diagnosis not present

## 2021-11-06 DIAGNOSIS — Z713 Dietary counseling and surveillance: Secondary | ICD-10-CM | POA: Diagnosis not present

## 2021-11-06 DIAGNOSIS — Z6832 Body mass index (BMI) 32.0-32.9, adult: Secondary | ICD-10-CM | POA: Diagnosis not present

## 2021-11-06 DIAGNOSIS — Z299 Encounter for prophylactic measures, unspecified: Secondary | ICD-10-CM | POA: Diagnosis not present

## 2021-11-06 DIAGNOSIS — Z0000 Encounter for general adult medical examination without abnormal findings: Secondary | ICD-10-CM | POA: Diagnosis not present

## 2021-11-06 DIAGNOSIS — I10 Essential (primary) hypertension: Secondary | ICD-10-CM | POA: Diagnosis not present

## 2021-11-06 DIAGNOSIS — F411 Generalized anxiety disorder: Secondary | ICD-10-CM | POA: Diagnosis not present

## 2021-11-13 DIAGNOSIS — I10 Essential (primary) hypertension: Secondary | ICD-10-CM | POA: Diagnosis not present

## 2021-11-20 DIAGNOSIS — I10 Essential (primary) hypertension: Secondary | ICD-10-CM | POA: Diagnosis not present

## 2021-11-20 DIAGNOSIS — Z6831 Body mass index (BMI) 31.0-31.9, adult: Secondary | ICD-10-CM | POA: Diagnosis not present

## 2021-11-20 DIAGNOSIS — J069 Acute upper respiratory infection, unspecified: Secondary | ICD-10-CM | POA: Diagnosis not present

## 2021-11-20 DIAGNOSIS — F411 Generalized anxiety disorder: Secondary | ICD-10-CM | POA: Diagnosis not present

## 2021-11-20 DIAGNOSIS — Z299 Encounter for prophylactic measures, unspecified: Secondary | ICD-10-CM | POA: Diagnosis not present

## 2021-12-13 DIAGNOSIS — I10 Essential (primary) hypertension: Secondary | ICD-10-CM | POA: Diagnosis not present

## 2022-01-12 DIAGNOSIS — I10 Essential (primary) hypertension: Secondary | ICD-10-CM | POA: Diagnosis not present

## 2022-01-18 DIAGNOSIS — D485 Neoplasm of uncertain behavior of skin: Secondary | ICD-10-CM | POA: Diagnosis not present

## 2022-01-18 DIAGNOSIS — Z08 Encounter for follow-up examination after completed treatment for malignant neoplasm: Secondary | ICD-10-CM | POA: Diagnosis not present

## 2022-01-18 DIAGNOSIS — Z1283 Encounter for screening for malignant neoplasm of skin: Secondary | ICD-10-CM | POA: Diagnosis not present

## 2022-01-18 DIAGNOSIS — Z85820 Personal history of malignant melanoma of skin: Secondary | ICD-10-CM | POA: Diagnosis not present

## 2022-01-18 DIAGNOSIS — L814 Other melanin hyperpigmentation: Secondary | ICD-10-CM | POA: Diagnosis not present

## 2022-01-18 DIAGNOSIS — D224 Melanocytic nevi of scalp and neck: Secondary | ICD-10-CM | POA: Diagnosis not present

## 2022-01-18 DIAGNOSIS — D225 Melanocytic nevi of trunk: Secondary | ICD-10-CM | POA: Diagnosis not present

## 2022-01-18 DIAGNOSIS — C44529 Squamous cell carcinoma of skin of other part of trunk: Secondary | ICD-10-CM | POA: Diagnosis not present

## 2022-01-18 DIAGNOSIS — B078 Other viral warts: Secondary | ICD-10-CM | POA: Diagnosis not present

## 2022-01-23 DIAGNOSIS — J449 Chronic obstructive pulmonary disease, unspecified: Secondary | ICD-10-CM | POA: Diagnosis not present

## 2022-01-23 DIAGNOSIS — I10 Essential (primary) hypertension: Secondary | ICD-10-CM | POA: Diagnosis not present

## 2022-01-23 DIAGNOSIS — Z299 Encounter for prophylactic measures, unspecified: Secondary | ICD-10-CM | POA: Diagnosis not present

## 2022-01-31 DIAGNOSIS — Z299 Encounter for prophylactic measures, unspecified: Secondary | ICD-10-CM | POA: Diagnosis not present

## 2022-01-31 DIAGNOSIS — J069 Acute upper respiratory infection, unspecified: Secondary | ICD-10-CM | POA: Diagnosis not present

## 2022-01-31 DIAGNOSIS — J449 Chronic obstructive pulmonary disease, unspecified: Secondary | ICD-10-CM | POA: Diagnosis not present

## 2022-01-31 DIAGNOSIS — R0981 Nasal congestion: Secondary | ICD-10-CM | POA: Diagnosis not present

## 2022-02-12 DIAGNOSIS — I10 Essential (primary) hypertension: Secondary | ICD-10-CM | POA: Diagnosis not present

## 2022-02-12 DIAGNOSIS — Z1283 Encounter for screening for malignant neoplasm of skin: Secondary | ICD-10-CM | POA: Diagnosis not present

## 2022-02-12 DIAGNOSIS — L988 Other specified disorders of the skin and subcutaneous tissue: Secondary | ICD-10-CM | POA: Diagnosis not present

## 2022-02-12 DIAGNOSIS — D485 Neoplasm of uncertain behavior of skin: Secondary | ICD-10-CM | POA: Diagnosis not present

## 2022-03-07 ENCOUNTER — Emergency Department (HOSPITAL_COMMUNITY)
Admission: EM | Admit: 2022-03-07 | Discharge: 2022-03-07 | Disposition: A | Discharge status: Home / Self Care | Payer: 59 | Attending: Emergency Medicine | Admitting: Emergency Medicine

## 2022-03-07 ENCOUNTER — Encounter (HOSPITAL_COMMUNITY): Payer: Self-pay | Admitting: *Deleted

## 2022-03-07 ENCOUNTER — Other Ambulatory Visit: Payer: Self-pay

## 2022-03-07 DIAGNOSIS — M79662 Pain in left lower leg: Secondary | ICD-10-CM | POA: Diagnosis not present

## 2022-03-07 DIAGNOSIS — M79661 Pain in right lower leg: Secondary | ICD-10-CM | POA: Insufficient documentation

## 2022-03-07 DIAGNOSIS — J449 Chronic obstructive pulmonary disease, unspecified: Secondary | ICD-10-CM | POA: Diagnosis not present

## 2022-03-07 HISTORY — DX: Malignant (primary) neoplasm, unspecified: C80.1

## 2022-03-07 LAB — CBC WITH DIFFERENTIAL/PLATELET
Abs Immature Granulocytes: 0.03 10*3/uL (ref 0.00–0.07)
Basophils Absolute: 0 10*3/uL (ref 0.0–0.1)
Basophils Relative: 1 %
Eosinophils Absolute: 0.1 10*3/uL (ref 0.0–0.5)
Eosinophils Relative: 2 %
HCT: 48.6 % (ref 39.0–52.0)
Hemoglobin: 16.4 g/dL (ref 13.0–17.0)
Immature Granulocytes: 0 %
Lymphocytes Relative: 25 %
Lymphs Abs: 2.2 10*3/uL (ref 0.7–4.0)
MCH: 29.5 pg (ref 26.0–34.0)
MCHC: 33.7 g/dL (ref 30.0–36.0)
MCV: 87.6 fL (ref 80.0–100.0)
Monocytes Absolute: 0.5 10*3/uL (ref 0.1–1.0)
Monocytes Relative: 5 %
Neutro Abs: 6 10*3/uL (ref 1.7–7.7)
Neutrophils Relative %: 67 %
Platelets: 166 10*3/uL (ref 150–400)
RBC: 5.55 MIL/uL (ref 4.22–5.81)
RDW: 13.5 % (ref 11.5–15.5)
WBC: 8.9 10*3/uL (ref 4.0–10.5)
nRBC: 0 % (ref 0.0–0.2)

## 2022-03-07 LAB — COMPREHENSIVE METABOLIC PANEL
ALT: 62 U/L — ABNORMAL HIGH (ref 0–44)
AST: 48 U/L — ABNORMAL HIGH (ref 15–41)
Albumin: 4.3 g/dL (ref 3.5–5.0)
Alkaline Phosphatase: 87 U/L (ref 38–126)
Anion gap: 11 (ref 5–15)
BUN: 11 mg/dL (ref 8–23)
CO2: 22 mmol/L (ref 22–32)
Calcium: 8.6 mg/dL — ABNORMAL LOW (ref 8.9–10.3)
Chloride: 103 mmol/L (ref 98–111)
Creatinine, Ser: 1.11 mg/dL (ref 0.61–1.24)
GFR, Estimated: >60 mL/min (ref >60)
Glucose, Bld: 108 mg/dL — ABNORMAL HIGH (ref 70–99)
Potassium: 3.3 mmol/L — ABNORMAL LOW (ref 3.5–5.1)
Sodium: 136 mmol/L (ref 135–145)
Total Bilirubin: 0.8 mg/dL (ref 0.3–1.2)
Total Protein: 7.2 g/dL (ref 6.5–8.1)

## 2022-03-07 LAB — CK: Total CK: 62 U/L (ref 49–397)

## 2022-03-07 LAB — MAGNESIUM: Magnesium: 2.2 mg/dL (ref 1.7–2.4)

## 2022-03-07 LAB — D-DIMER, QUANTITATIVE: D-Dimer, Quant: 0.31 ug/mL-FEU (ref 0.00–0.50)

## 2022-03-07 MED ORDER — METHOCARBAMOL 500 MG PO TABS: 500.0000 mg | ORAL_TABLET | Freq: Once | ORAL | Status: DC

## 2022-03-07 MED ORDER — ACETAMINOPHEN 325 MG PO TABS
650.0000 mg | ORAL_TABLET | Freq: Once | ORAL | Status: AC
  Administered 2022-03-07: 650 mg via ORAL
  Filled 2022-03-07: qty 2

## 2022-03-07 MED ORDER — POTASSIUM CHLORIDE CRYS ER 20 MEQ PO TBCR
40.0000 meq | EXTENDED_RELEASE_TABLET | Freq: Once | ORAL | Status: AC
  Administered 2022-03-07: 40 meq via ORAL
  Filled 2022-03-07: qty 2

## 2022-03-07 NOTE — ED Provider Notes (Signed)
[facility] Provider Note   CSN: [id number] Arrival date & time: 03/07/22  1453     History  Chief Complaint  Patient presents with   Leg Pain    Harry Riley is a 67 y.o. male.   Leg Pain Patient presents for bilateral lower leg pain.  Medical history includes COPD, GERD.  Pain has been ongoing for the past couple weeks.  He has had intermittent swelling.  He describes the location of pain in bilateral calfs.  Pain is described as crampy in nature.  He notices that sometimes it is worse early in the morning.  He has been using a cane to offset weight from his painful legs.  He denies any other recent symptoms.     Home Medications Prior to Admission medications   Medication Sig Start Date End Date Taking? Authorizing Provider  albuterol (VENTOLIN HFA) 108 (90 Base) MCG/ACT inhaler INHALE 2 PUFFS BY MOUTH 4 TIMES A DAY AS NEEDED Patient taking differently: Inhale 2 puffs into the lungs every 4 (four) hours as needed for shortness of breath. 08/29/20  Yes Ailene Ards, NP  amLODipine (NORVASC) 5 MG tablet Take 5 mg by mouth 2 (two) times daily. 12/15/21  Yes [provider]  busPIRone (BUSPAR) 10 MG tablet Take 10 mg by mouth at bedtime. 10/16/21  Yes [provider]  Cholecalciferol (VITAMIN D-3) 125 MCG (5000 UT) TABS Take 5,000 Units by mouth daily.   Yes [provider]  famotidine (PEPCID) 20 MG tablet Take 20 mg by mouth daily. 11/15/21  Yes [provider]  fluticasone (FLONASE) 50 MCG/ACT nasal spray Place 2 sprays into both nostrils daily.   Yes [provider]  levocetirizine (XYZAL) 5 MG tablet TAKE 1 TABLET BY MOUTH EVERY DAY IN THE EVENING 03/10/20  Yes Gosrani, Nimish C, MD  metoprolol tartrate (LOPRESSOR) 25 MG tablet Take 25 mg by mouth daily. 01/03/22  Yes [provider]  omeprazole (PRILOSEC) 40 MG capsule  09/15/21  Yes [provider]  rosuvastatin  (CRESTOR) 20 MG tablet  10/16/21  Yes [provider]  sertraline (ZOLOFT) 25 MG tablet  12/01/21  Yes [provider]  Donnal Debar 100-62.5-25 MCG/ACT AEPB  12/15/21  Yes [provider]  azithromycin (ZITHROMAX) 250 MG tablet  11/20/21   [provider]  cyclobenzaprine (FLEXERIL) 10 MG tablet Take 1 tablet (10 mg total) by mouth 2 (two) times daily as needed. Patient not taking: Reported on 03/07/2022 04/04/20   Ailene Ards, NP  pantoprazole (PROTONIX) 40 MG tablet Take 1 tablet (40 mg total) by mouth daily. 09/05/20 12/04/20  Ailene Ards, NP  tiotropium (SPIRIVA HANDIHALER) 18 MCG inhalation capsule Place 1 capsule (18 mcg total) into inhaler and inhale daily. 04/04/20   Ailene Ards, NP  vitamin B-12 (CYANOCOBALAMIN) 1000 MCG tablet Take 1,000 mcg by mouth daily.    [provider]  vitamin C (ASCORBIC ACID) 500 MG tablet Take 500 mg by mouth daily.    [provider]      Allergies    Penicillins    Review of Systems   Review of Systems  Musculoskeletal:  Positive for myalgias.  All other systems reviewed and are negative.   Physical Exam Updated Vital Signs BP (!) 150/87   Pulse 87   Temp 98.9 F (37.2 C) (Oral)   Resp 16   Ht 5' 10"$  (1.778 m)   Wt 108.9 kg  SpO2 99%   BMI 34.44 kg/m  Physical Exam Vitals and nursing note reviewed.  Constitutional:      General: He is not in acute distress.    Appearance: Normal appearance. He is well-developed. He is not ill-appearing, toxic-appearing or diaphoretic.  HENT:     Head: Normocephalic and atraumatic.     Right Ear: External ear normal.     Left Ear: External ear normal.     Nose: Nose normal.     Mouth/Throat:     Mouth: Mucous membranes are moist.  Eyes:     Extraocular Movements: Extraocular movements intact.     Conjunctiva/sclera: Conjunctivae normal.  Cardiovascular:     Rate and Rhythm: Normal rate and regular rhythm.     Heart sounds: No murmur  heard. Pulmonary:     Effort: Pulmonary effort is normal. No respiratory distress.  Abdominal:     General: There is no distension.     Palpations: Abdomen is soft.  Musculoskeletal:        General: Tenderness (Bilateral calfs) present. No swelling.     Cervical back: Normal range of motion and neck supple.     Right lower leg: No edema.     Left lower leg: No edema.     Comments: Bevelyn Buckles' sign negative  Skin:    General: Skin is warm and dry.     Capillary Refill: Capillary refill takes less than 2 seconds.     Coloration: Skin is not jaundiced or pale.  Neurological:     General: No focal deficit present.     Mental Status: He is alert and oriented to person, place, and time.     Cranial Nerves: No cranial nerve deficit.     Sensory: No sensory deficit.     Motor: No weakness.     Coordination: Coordination normal.  Psychiatric:        Mood and Affect: Mood normal.        Behavior: Behavior normal.        Thought Content: Thought content normal.        Judgment: Judgment normal.     ED Results / Procedures / Treatments   Labs (all labs ordered are listed, but only abnormal results are displayed) Labs Reviewed  COMPREHENSIVE METABOLIC PANEL - Abnormal; Notable for the following components:      Result Value   Potassium 3.3 (*)    Glucose, Bld 108 (*)    Calcium 8.6 (*)    AST 48 (*)    ALT 62 (*)    All other components within normal limits  MAGNESIUM  CBC WITH DIFFERENTIAL/PLATELET  D-DIMER, QUANTITATIVE  CK    EKG None  Radiology No results found.  Procedures Procedures    Medications Ordered in ED Medications  potassium chloride SA (KLOR-CON M) CR tablet 40 mEq (has no administration in time range)  acetaminophen (TYLENOL) tablet 650 mg (650 mg Oral Given 03/07/22 1934)    ED Course/ Medical Decision Making/ A&P                             Medical Decision Making Amount and/or Complexity of Data Reviewed Labs: ordered.  Risk OTC  drugs. Prescription drug management.   Patient presenting for bilateral calf pain and cramps over the past several weeks.  Symptoms are intermittent.  On arrival in the ED, patient denies any pain at rest.  I do not appreciate any swelling  to his lower extremities.  Distal feet are well-perfused.  Pulses are intact.  Bevelyn Buckles' sign is negative.  He does have some mild tenderness to bilateral calf muscles.  Differential diagnosis includes electrolyte abnormalities, rhabdomyolysis, DVT, venous insufficiency.  Patient does report that he is on a statin and has been for the past year.  He is not on a blood thinner.  Laboratory workup was initiated.  Lab work is notable for mild hypokalemia.  Replacement potassium was ordered.  Notably, D-dimer and CK are normal.  Patient was informed of results.  He is stable for discharge at this time.        Final Clinical Impression(s) / ED Diagnoses Final diagnoses:  Bilateral calf pain    Rx / DC Orders ED Discharge Orders     None         Godfrey Pick, MD 03/07/22 2042

## 2022-03-07 NOTE — Discharge Instructions (Signed)
Your lab work is reassuring.  Your potassium was slightly low and this was replaced while in the emergency department.  Follow-up with your primary care doctor.  Return to the emergency department for any new or worsening symptoms of concern.

## 2022-03-07 NOTE — ED Triage Notes (Signed)
Pt with bilateral lower leg pain for past couple weeks, pt states swelling off and on. Pain with walking or put pressure.

## 2022-03-12 DIAGNOSIS — I10 Essential (primary) hypertension: Secondary | ICD-10-CM | POA: Diagnosis not present

## 2022-03-12 DIAGNOSIS — E876 Hypokalemia: Secondary | ICD-10-CM | POA: Diagnosis not present

## 2022-03-12 DIAGNOSIS — R600 Localized edema: Secondary | ICD-10-CM | POA: Diagnosis not present

## 2022-03-12 DIAGNOSIS — Z299 Encounter for prophylactic measures, unspecified: Secondary | ICD-10-CM | POA: Diagnosis not present

## 2022-03-12 DIAGNOSIS — E7800 Pure hypercholesterolemia, unspecified: Secondary | ICD-10-CM | POA: Diagnosis not present

## 2022-03-12 DIAGNOSIS — J449 Chronic obstructive pulmonary disease, unspecified: Secondary | ICD-10-CM | POA: Diagnosis not present

## 2022-03-14 DIAGNOSIS — I10 Essential (primary) hypertension: Secondary | ICD-10-CM | POA: Diagnosis not present

## 2022-03-26 DIAGNOSIS — J449 Chronic obstructive pulmonary disease, unspecified: Secondary | ICD-10-CM | POA: Diagnosis not present

## 2022-03-26 DIAGNOSIS — Z299 Encounter for prophylactic measures, unspecified: Secondary | ICD-10-CM | POA: Diagnosis not present

## 2022-03-26 DIAGNOSIS — I10 Essential (primary) hypertension: Secondary | ICD-10-CM | POA: Diagnosis not present

## 2022-03-26 DIAGNOSIS — E7800 Pure hypercholesterolemia, unspecified: Secondary | ICD-10-CM | POA: Diagnosis not present

## 2022-04-14 DIAGNOSIS — I10 Essential (primary) hypertension: Secondary | ICD-10-CM | POA: Diagnosis not present

## 2022-04-23 DIAGNOSIS — K0510 Chronic gingivitis, plaque induced: Secondary | ICD-10-CM | POA: Diagnosis not present

## 2022-04-23 DIAGNOSIS — S00522S Blister (nonthermal) of oral cavity, sequela: Secondary | ICD-10-CM | POA: Diagnosis not present

## 2022-04-23 DIAGNOSIS — J449 Chronic obstructive pulmonary disease, unspecified: Secondary | ICD-10-CM | POA: Diagnosis not present

## 2022-04-23 DIAGNOSIS — R030 Elevated blood-pressure reading, without diagnosis of hypertension: Secondary | ICD-10-CM | POA: Diagnosis not present

## 2022-04-23 DIAGNOSIS — Z0000 Encounter for general adult medical examination without abnormal findings: Secondary | ICD-10-CM | POA: Diagnosis not present

## 2022-04-23 DIAGNOSIS — Z7189 Other specified counseling: Secondary | ICD-10-CM | POA: Diagnosis not present

## 2022-04-23 DIAGNOSIS — Z299 Encounter for prophylactic measures, unspecified: Secondary | ICD-10-CM | POA: Diagnosis not present

## 2022-05-15 DIAGNOSIS — I10 Essential (primary) hypertension: Secondary | ICD-10-CM | POA: Diagnosis not present

## 2022-06-15 DIAGNOSIS — I10 Essential (primary) hypertension: Secondary | ICD-10-CM | POA: Diagnosis not present

## 2022-06-25 DIAGNOSIS — Z0000 Encounter for general adult medical examination without abnormal findings: Secondary | ICD-10-CM | POA: Diagnosis not present

## 2022-06-25 DIAGNOSIS — I10 Essential (primary) hypertension: Secondary | ICD-10-CM | POA: Diagnosis not present

## 2022-06-25 DIAGNOSIS — E7800 Pure hypercholesterolemia, unspecified: Secondary | ICD-10-CM | POA: Diagnosis not present

## 2022-06-25 DIAGNOSIS — R5383 Other fatigue: Secondary | ICD-10-CM | POA: Diagnosis not present

## 2022-06-25 DIAGNOSIS — Z79899 Other long term (current) drug therapy: Secondary | ICD-10-CM | POA: Diagnosis not present

## 2022-06-25 DIAGNOSIS — M5126 Other intervertebral disc displacement, lumbar region: Secondary | ICD-10-CM | POA: Diagnosis not present

## 2022-06-25 DIAGNOSIS — Z299 Encounter for prophylactic measures, unspecified: Secondary | ICD-10-CM | POA: Diagnosis not present

## 2022-07-11 DIAGNOSIS — R262 Difficulty in walking, not elsewhere classified: Secondary | ICD-10-CM | POA: Diagnosis not present

## 2022-07-15 DIAGNOSIS — I10 Essential (primary) hypertension: Secondary | ICD-10-CM | POA: Diagnosis not present

## 2022-07-16 DIAGNOSIS — R262 Difficulty in walking, not elsewhere classified: Secondary | ICD-10-CM | POA: Diagnosis not present

## 2022-07-24 DIAGNOSIS — R262 Difficulty in walking, not elsewhere classified: Secondary | ICD-10-CM | POA: Diagnosis not present

## 2022-07-26 DIAGNOSIS — R262 Difficulty in walking, not elsewhere classified: Secondary | ICD-10-CM | POA: Diagnosis not present

## 2022-07-30 DIAGNOSIS — R262 Difficulty in walking, not elsewhere classified: Secondary | ICD-10-CM | POA: Diagnosis not present

## 2022-08-02 DIAGNOSIS — R262 Difficulty in walking, not elsewhere classified: Secondary | ICD-10-CM | POA: Diagnosis not present

## 2022-08-06 DIAGNOSIS — R262 Difficulty in walking, not elsewhere classified: Secondary | ICD-10-CM | POA: Diagnosis not present

## 2022-08-08 DIAGNOSIS — R262 Difficulty in walking, not elsewhere classified: Secondary | ICD-10-CM | POA: Diagnosis not present

## 2022-08-09 DIAGNOSIS — J019 Acute sinusitis, unspecified: Secondary | ICD-10-CM | POA: Diagnosis not present

## 2022-08-14 DIAGNOSIS — R262 Difficulty in walking, not elsewhere classified: Secondary | ICD-10-CM | POA: Diagnosis not present

## 2022-08-15 DIAGNOSIS — I10 Essential (primary) hypertension: Secondary | ICD-10-CM | POA: Diagnosis not present

## 2022-08-16 DIAGNOSIS — R262 Difficulty in walking, not elsewhere classified: Secondary | ICD-10-CM | POA: Diagnosis not present

## 2022-08-21 DIAGNOSIS — R262 Difficulty in walking, not elsewhere classified: Secondary | ICD-10-CM | POA: Diagnosis not present

## 2022-08-23 ENCOUNTER — Other Ambulatory Visit: Payer: Self-pay | Admitting: Internal Medicine

## 2022-08-23 DIAGNOSIS — R262 Difficulty in walking, not elsewhere classified: Secondary | ICD-10-CM | POA: Diagnosis not present

## 2022-08-23 DIAGNOSIS — Z1231 Encounter for screening mammogram for malignant neoplasm of breast: Secondary | ICD-10-CM

## 2022-08-27 DIAGNOSIS — R262 Difficulty in walking, not elsewhere classified: Secondary | ICD-10-CM | POA: Diagnosis not present

## 2022-08-28 ENCOUNTER — Inpatient Hospital Stay: Admission: RE | Admit: 2022-08-28 | Payer: 59 | Source: Ambulatory Visit

## 2022-09-15 DIAGNOSIS — I10 Essential (primary) hypertension: Secondary | ICD-10-CM | POA: Diagnosis not present

## 2022-10-15 DIAGNOSIS — I10 Essential (primary) hypertension: Secondary | ICD-10-CM | POA: Diagnosis not present

## 2022-11-14 DIAGNOSIS — I10 Essential (primary) hypertension: Secondary | ICD-10-CM | POA: Diagnosis not present

## 2022-12-10 DIAGNOSIS — Z85820 Personal history of malignant melanoma of skin: Secondary | ICD-10-CM | POA: Diagnosis not present

## 2022-12-10 DIAGNOSIS — Z08 Encounter for follow-up examination after completed treatment for malignant neoplasm: Secondary | ICD-10-CM | POA: Diagnosis not present

## 2022-12-10 DIAGNOSIS — B078 Other viral warts: Secondary | ICD-10-CM | POA: Diagnosis not present

## 2022-12-10 DIAGNOSIS — D225 Melanocytic nevi of trunk: Secondary | ICD-10-CM | POA: Diagnosis not present

## 2022-12-10 DIAGNOSIS — Z1283 Encounter for screening for malignant neoplasm of skin: Secondary | ICD-10-CM | POA: Diagnosis not present

## 2022-12-10 DIAGNOSIS — D485 Neoplasm of uncertain behavior of skin: Secondary | ICD-10-CM | POA: Diagnosis not present

## 2022-12-14 DIAGNOSIS — I10 Essential (primary) hypertension: Secondary | ICD-10-CM | POA: Diagnosis not present

## 2023-01-14 DIAGNOSIS — I10 Essential (primary) hypertension: Secondary | ICD-10-CM | POA: Diagnosis not present

## 2023-01-18 DIAGNOSIS — K828 Other specified diseases of gallbladder: Secondary | ICD-10-CM | POA: Diagnosis not present

## 2023-01-18 DIAGNOSIS — R109 Unspecified abdominal pain: Secondary | ICD-10-CM | POA: Diagnosis not present

## 2023-01-18 DIAGNOSIS — K8020 Calculus of gallbladder without cholecystitis without obstruction: Secondary | ICD-10-CM | POA: Diagnosis not present

## 2023-01-18 DIAGNOSIS — R1900 Intra-abdominal and pelvic swelling, mass and lump, unspecified site: Secondary | ICD-10-CM | POA: Diagnosis not present

## 2023-01-18 DIAGNOSIS — K760 Fatty (change of) liver, not elsewhere classified: Secondary | ICD-10-CM | POA: Diagnosis not present

## 2023-01-18 DIAGNOSIS — R400 Somnolence: Secondary | ICD-10-CM | POA: Diagnosis not present

## 2023-01-28 DIAGNOSIS — N200 Calculus of kidney: Secondary | ICD-10-CM | POA: Diagnosis not present

## 2023-01-28 DIAGNOSIS — N281 Cyst of kidney, acquired: Secondary | ICD-10-CM | POA: Diagnosis not present

## 2023-01-28 DIAGNOSIS — K5730 Diverticulosis of large intestine without perforation or abscess without bleeding: Secondary | ICD-10-CM | POA: Diagnosis not present

## 2023-01-28 DIAGNOSIS — K760 Fatty (change of) liver, not elsewhere classified: Secondary | ICD-10-CM | POA: Diagnosis not present

## 2023-01-28 DIAGNOSIS — K5732 Diverticulitis of large intestine without perforation or abscess without bleeding: Secondary | ICD-10-CM | POA: Diagnosis not present

## 2023-01-28 DIAGNOSIS — K8020 Calculus of gallbladder without cholecystitis without obstruction: Secondary | ICD-10-CM | POA: Diagnosis not present

## 2023-01-28 DIAGNOSIS — R1904 Left lower quadrant abdominal swelling, mass and lump: Secondary | ICD-10-CM | POA: Diagnosis not present

## 2023-01-30 DIAGNOSIS — N200 Calculus of kidney: Secondary | ICD-10-CM | POA: Diagnosis not present

## 2023-01-30 DIAGNOSIS — R1900 Intra-abdominal and pelvic swelling, mass and lump, unspecified site: Secondary | ICD-10-CM | POA: Diagnosis not present

## 2023-01-30 DIAGNOSIS — R109 Unspecified abdominal pain: Secondary | ICD-10-CM | POA: Diagnosis not present

## 2023-02-13 DIAGNOSIS — I10 Essential (primary) hypertension: Secondary | ICD-10-CM | POA: Diagnosis not present

## 2023-03-15 DIAGNOSIS — I10 Essential (primary) hypertension: Secondary | ICD-10-CM | POA: Diagnosis not present

## 2023-03-20 DIAGNOSIS — J449 Chronic obstructive pulmonary disease, unspecified: Secondary | ICD-10-CM | POA: Diagnosis not present

## 2023-03-20 DIAGNOSIS — K6389 Other specified diseases of intestine: Secondary | ICD-10-CM | POA: Diagnosis not present

## 2023-03-21 ENCOUNTER — Ambulatory Visit: Payer: Self-pay | Admitting: General Surgery

## 2023-03-21 MED ORDER — DEXTROSE 5 % IV SOLN: 900.0000 mg | INTRAVENOUS | Status: AC

## 2023-03-21 MED ORDER — GENTAMICIN SULFATE 40 MG/ML IJ SOLN: 5.0000 mg/kg | INTRAVENOUS | Status: AC

## 2023-03-29 NOTE — Progress Notes (Addendum)
 PCP -Tresa Res, FNP-C  [facility] internal medicine  Cardiologist - no  PPM/ICD -  Device Orders -  Rep Notified -   Chest x-ray -  EKG -  Stress Test - 04-18-21 CE ECHO -  Cardiac Cath -   Sleep Study -  CPAP -   Fasting Blood Sugar -  Checks Blood Sugar _____ times a day  Blood Thinner Instructions: Aspirin Instructions:  ERAS Protcol - PRE-SURGERY Ensure     COVID vaccine -no  Activity--Able to climb a flight of stairs no CP some SOM not new for pt  Anesthesia review: COPD, HTN, Does not read or write well, MI 2015 he thinks.  Patient denies shortness of breath, fever, cough and chest pain at PAT appointment   All instructions explained to the patient, with a verbal understanding of the material. Patient agrees to go over the instructions while at home for a better understanding. Patient also instructed to self quarantine after being tested for COVID-19. The opportunity to ask questions was provided.

## 2023-03-29 NOTE — Patient Instructions (Addendum)
 SURGICAL WAITING ROOM VISITATION  Patients having surgery or a procedure may have no more than 2 support people in the waiting area - these visitors may rotate.    Children under the age of 34 must have an adult with them who is not the patient.  Due to an increase in RSV and influenza rates and associated hospitalizations, children ages 63 and under may not visit patients in [facility] hospitals.  Visitors with respiratory illnesses are discouraged from visiting and should remain at home.  If the patient needs to stay at the hospital during part of their recovery, the visitor guidelines for inpatient rooms apply. Pre-op nurse will coordinate an appropriate time for 1 support person to accompany patient in pre-op.  This support person may not rotate.    Please refer to the [facility] website for the visitor guidelines for Inpatients (after your surgery is over and you are in a regular room).       Your procedure is scheduled on: 04-08-23   Report to [facility] Main Entrance    Report to admitting at       0855  AM   Call this number if you have problems the morning of surgery [phone]   Do not eat food :After Midnight.   After Midnight you may have the following liquids until __0800  ____ AM/  DAY OF SURGERY  then nothing by mouth  Water Non-Citrus Juices (without pulp, NO RED-Apple, White grape, White cranberry) Black Coffee (NO MILK/CREAM OR CREAMERS, sugar ok)  Clear Tea (NO MILK/CREAM OR CREAMERS, sugar ok) regular and decaf                             Plain Jell-O (NO RED)                                           Fruit ices (not with fruit pulp, NO RED)                                     Popsicles (NO RED)                                                               Sports drinks like Gatorade (NO RED)                    The day of surgery:  Drink ONE (1) Pre-Surgery Clear Ensure BY 0800  AM the morning of surgery. Drink in one sitting. Do not sip.   This drink was given to you during your hospital  pre-op appointment visit. Nothing else to drink after completing the  Pre-Surgery Clear Ensure .          If you have questions, please contact your surgeon's office.   FOLLOW BOWEL PREP AND ANY ADDITIONAL PRE OP INSTRUCTIONS YOU RECEIVED FROM YOUR SURGEON'S OFFICE!!!     Oral Hygiene is also important to reduce your risk of infection.  Remember - BRUSH YOUR TEETH THE MORNING OF SURGERY WITH YOUR REGULAR TOOTHPASTE  DENTURES WILL BE REMOVED PRIOR TO SURGERY PLEASE DO NOT APPLY "Poly grip" OR ADHESIVES!!!   Do NOT smoke after Midnight   Stop all vitamins and herbal supplements 7 days before surgery.   Take these medicines the morning of surgery with A SIP OF WATER: inhaler and bring rescue inhaler with you,  rosuvastatin, metoprolol, flonase, famotidine, amlodipine    Bring CPAP mask and tubing day of surgery.                              You may not have any metal on your body including hair pins, jewelry, and body piercing             Do not wear  lotions, powders, /cologne, or deodorant               Men may shave face and neck.   Do not bring valuables to the hospital. [facility] IS NOT             RESPONSIBLE   FOR VALUABLES.   Contacts, glasses, dentures or bridgework may not be worn into surgery.   Bring small overnight bag day of surgery.   DO NOT BRING YOUR HOME MEDICATIONS TO THE HOSPITAL. PHARMACY WILL DISPENSE MEDICATIONS LISTED ON YOUR MEDICATION LIST TO YOU DURING YOUR ADMISSION IN THE HOSPITAL!    Patients discharged on the day of surgery will not be allowed to drive home.  Someone NEEDS to stay with you for the first 24 hours after anesthesia.   Special Instructions: Bring a copy of your healthcare power of attorney and living will documents the day of surgery if you haven't scanned them before.              Please read over the following fact sheets you were given: IF  YOU HAVE QUESTIONS ABOUT YOUR PRE-OP INSTRUCTIONS PLEASE CALL [phone]    If you test positive for Covid or have been in contact with anyone that has tested positive in the last 10 days please notify you surgeon.    [facility] - Preparing for Surgery Before surgery, you can play an important role.  Because skin is not sterile, your skin needs to be as free of germs as possible.  You can reduce the number of germs on your skin by washing with CHG (chlorahexidine gluconate) soap before surgery.  CHG is an antiseptic cleaner which kills germs and bonds with the skin to continue killing germs even after washing. Please DO NOT use if you have an allergy to CHG or antibacterial soaps.  If your skin becomes reddened/irritated stop using the CHG and inform your nurse when you arrive at Short Stay. Do not shave (including legs and underarms) for at least 48 hours prior to the first CHG shower.  You may shave your face/neck. Please follow these instructions carefully:  1.  Shower with CHG Soap the night before surgery and the  morning of Surgery.  2.  If you choose to wash your hair, wash your hair first as usual with your  normal  shampoo.  3.  After you shampoo, rinse your hair and body thoroughly to remove the  shampoo.                           4.  Use CHG as you  would any other liquid soap.  You can apply chg directly  to the skin and wash                       Gently with a scrungie or clean washcloth.  5.  Apply the CHG Soap to your body ONLY FROM THE NECK DOWN.   Do not use on face/ open                           Wound or open sores. Avoid contact with eyes, ears mouth and genitals (private parts).                       Wash face,  Genitals (private parts) with your normal soap.             6.  Wash thoroughly, paying special attention to the area where your surgery  will be performed.  7.  Thoroughly rinse your body with warm water from the neck down.  8.  DO NOT shower/wash with your normal  soap after using and rinsing off  the CHG Soap.                9.  Pat yourself dry with a clean towel.            10.  Wear clean pajamas.            11.  Place clean sheets on your bed the night of your first shower and do not  sleep with pets. Day of Surgery : Do not apply any lotions/deodorants the morning of surgery.  Please wear clean clothes to the hospital/surgery center.  FAILURE TO FOLLOW THESE INSTRUCTIONS MAY RESULT IN THE CANCELLATION OF YOUR SURGERY PATIENT SIGNATURE_________________________________  NURSE SIGNATURE__________________________________  ________________________________________________________________________

## 2023-04-03 ENCOUNTER — Other Ambulatory Visit: Payer: Self-pay

## 2023-04-03 ENCOUNTER — Encounter (HOSPITAL_COMMUNITY): Payer: Self-pay

## 2023-04-03 ENCOUNTER — Encounter (HOSPITAL_COMMUNITY)
Admission: RE | Admit: 2023-04-03 | Discharge: 2023-04-03 | Disposition: A | Discharge status: Home / Self Care | Source: Ambulatory Visit | Attending: General Surgery | Admitting: General Surgery

## 2023-04-03 VITALS — BP 148/64 | HR 83 | Temp 97.9°F | Resp 18 | Ht 69.0 in | Wt 227.0 lb

## 2023-04-03 DIAGNOSIS — K760 Fatty (change of) liver, not elsewhere classified: Secondary | ICD-10-CM | POA: Insufficient documentation

## 2023-04-03 DIAGNOSIS — Z01810 Encounter for preprocedural cardiovascular examination: Secondary | ICD-10-CM | POA: Diagnosis present

## 2023-04-03 DIAGNOSIS — J449 Chronic obstructive pulmonary disease, unspecified: Secondary | ICD-10-CM | POA: Diagnosis not present

## 2023-04-03 DIAGNOSIS — Z01818 Encounter for other preprocedural examination: Secondary | ICD-10-CM | POA: Insufficient documentation

## 2023-04-03 DIAGNOSIS — K6389 Other specified diseases of intestine: Secondary | ICD-10-CM | POA: Diagnosis not present

## 2023-04-03 DIAGNOSIS — I10 Essential (primary) hypertension: Secondary | ICD-10-CM | POA: Diagnosis not present

## 2023-04-03 DIAGNOSIS — Z87891 Personal history of nicotine dependence: Secondary | ICD-10-CM | POA: Diagnosis not present

## 2023-04-03 DIAGNOSIS — Z01812 Encounter for preprocedural laboratory examination: Secondary | ICD-10-CM | POA: Diagnosis present

## 2023-04-03 HISTORY — DX: Anxiety disorder, unspecified: F41.9

## 2023-04-03 HISTORY — DX: Personal history of other diseases of the digestive system: Z87.19

## 2023-04-03 HISTORY — DX: Acute myocardial infarction, unspecified: I21.9

## 2023-04-03 HISTORY — DX: Personal history of urinary calculi: Z87.442

## 2023-04-03 HISTORY — DX: Essential (primary) hypertension: I10

## 2023-04-03 LAB — CBC
HCT: 48.9 % (ref 39.0–52.0)
Hemoglobin: 16.6 g/dL (ref 13.0–17.0)
MCH: 29.2 pg (ref 26.0–34.0)
MCHC: 33.9 g/dL (ref 30.0–36.0)
MCV: 85.9 fL (ref 80.0–100.0)
Platelets: 147 10*3/uL — ABNORMAL LOW (ref 150–400)
RBC: 5.69 MIL/uL (ref 4.22–5.81)
RDW: 13.7 % (ref 11.5–15.5)
WBC: 9.6 10*3/uL (ref 4.0–10.5)
nRBC: 0 % (ref 0.0–0.2)

## 2023-04-03 LAB — BASIC METABOLIC PANEL
Anion gap: 10 (ref 5–15)
BUN: 10 mg/dL (ref 8–23)
CO2: 21 mmol/L — ABNORMAL LOW (ref 22–32)
Calcium: 8.6 mg/dL — ABNORMAL LOW (ref 8.9–10.3)
Chloride: 105 mmol/L (ref 98–111)
Creatinine, Ser: 1.09 mg/dL (ref 0.61–1.24)
GFR, Estimated: >60 mL/min (ref >60)
Glucose, Bld: 88 mg/dL (ref 70–99)
Potassium: 3.3 mmol/L — ABNORMAL LOW (ref 3.5–5.1)
Sodium: 136 mmol/L (ref 135–145)

## 2023-04-05 NOTE — Progress Notes (Signed)
 Anesthesia Chart Review   Case: [id number] Date/Time: 04/08/23 1050   Procedure: EXCISION, SMALL INTESTINE, ROBOT-ASSISTED - ROBOTIC REMOVAL OF MASS IN SMALL INTESTINE, POSSIBLE ROBOTIC SMALL BOWEL RESECTION WITH ANASTOMOSIS   Anesthesia type: General   Diagnosis: Mass of small intestine [K63.89]   Pre-op diagnosis: Mass of small intestine   Location: [facility] ROOM 02 / [facility]   Surgeons: Kinsinger, De Blanch, MD       DISCUSSION:67 y.o. former smoker with h/o COPD, HTN, fatty liver, mass of small intestine scheduled for above procedure 04/08/2023 with Dr. Feliciana Rossetti.   Questionable MI in 2015.  No diagnosis or mention in PCP notes.  Stress test 04/18/2021 (in Care Everywhere) with no ischemia.   VS: BP (!) 148/64   Pulse 83   Temp 36.6 C (Oral)   Resp 18   Ht 5\' 9"  (1.753 m)   Wt 103 kg   SpO2 97%   BMI 33.52 kg/m   PROVIDERS: [facility], [facility]   LABS: Labs reviewed: Acceptable for surgery. (all labs ordered are listed, but only abnormal results are displayed)  Labs Reviewed  BASIC METABOLIC PANEL - Abnormal; Notable for the following components:      Result Value   Potassium 3.3 (*)    CO2 21 (*)    Calcium 8.6 (*)    All other components within normal limits  CBC - Abnormal; Notable for the following components:   Platelets 147 (*)    All other components within normal limits  TYPE AND SCREEN     IMAGES:   EKG:   CV:  Past Medical History:  Diagnosis Date   Anxiety    Cancer (HCC)    skin   COPD (chronic obstructive pulmonary disease) (HCC) 10/02/2018   Elevated liver enzymes 10/02/2018   GERD (gastroesophageal reflux disease) 10/02/2018   History of hiatal hernia    History of kidney stones    Hypertension    Melanoma in situ of left lower extremity (HCC) 07/29/2019   Myocardial infarction (HCC)    2015 estimated   Obesity (BMI 30.0-34.9) 10/02/2018   Vitamin D deficiency disease 10/02/2018    Past Surgical History:  Procedure  Laterality Date   NO PAST SURGERIES      MEDICATIONS:  albuterol (VENTOLIN HFA) 108 (90 Base) MCG/ACT inhaler   amLODipine (NORVASC) 5 MG tablet   dextromethorphan-guaiFENesin (MUCINEX DM) 30-600 MG 12hr tablet   famotidine (PEPCID) 20 MG tablet   fluticasone (FLONASE) 50 MCG/ACT nasal spray   metoprolol tartrate (LOPRESSOR) 25 MG tablet   rosuvastatin (CRESTOR) 5 MG tablet   TRELEGY ELLIPTA 100-62.5-25 MCG/ACT AEPB   No current facility-administered medications for this encounter.     Jodell Cipro Ward, PA-C [facility] Pre-Surgical Testing [phone]

## 2023-04-08 ENCOUNTER — Other Ambulatory Visit: Payer: Self-pay

## 2023-04-08 ENCOUNTER — Inpatient Hospital Stay (HOSPITAL_COMMUNITY): Payer: Self-pay | Admitting: Physician Assistant

## 2023-04-08 ENCOUNTER — Encounter (HOSPITAL_COMMUNITY): Payer: Self-pay | Admitting: General Surgery

## 2023-04-08 ENCOUNTER — Inpatient Hospital Stay (HOSPITAL_COMMUNITY)
Admission: RE | Admit: 2023-04-08 | Discharge: 2023-04-10 | DRG: 358 | Disposition: A | Discharge status: Home / Self Care | Attending: General Surgery | Admitting: General Surgery

## 2023-04-08 ENCOUNTER — Encounter (HOSPITAL_COMMUNITY)
Admission: RE | Disposition: A | Discharge status: Home / Self Care | Payer: Self-pay | Source: Home / Self Care | Attending: General Surgery

## 2023-04-08 ENCOUNTER — Inpatient Hospital Stay (HOSPITAL_COMMUNITY): Admitting: Anesthesiology

## 2023-04-08 DIAGNOSIS — I10 Essential (primary) hypertension: Secondary | ICD-10-CM

## 2023-04-08 DIAGNOSIS — K6389 Other specified diseases of intestine: Secondary | ICD-10-CM

## 2023-04-08 DIAGNOSIS — Z5331 Laparoscopic surgical procedure converted to open procedure: Secondary | ICD-10-CM | POA: Diagnosis not present

## 2023-04-08 DIAGNOSIS — K5000 Crohn's disease of small intestine without complications: Secondary | ICD-10-CM | POA: Diagnosis not present

## 2023-04-08 DIAGNOSIS — Z79899 Other long term (current) drug therapy: Secondary | ICD-10-CM | POA: Diagnosis not present

## 2023-04-08 DIAGNOSIS — J449 Chronic obstructive pulmonary disease, unspecified: Secondary | ICD-10-CM | POA: Diagnosis present

## 2023-04-08 DIAGNOSIS — R1909 Other intra-abdominal and pelvic swelling, mass and lump: Principal | ICD-10-CM | POA: Diagnosis present

## 2023-04-08 DIAGNOSIS — Z87891 Personal history of nicotine dependence: Secondary | ICD-10-CM

## 2023-04-08 DIAGNOSIS — F419 Anxiety disorder, unspecified: Secondary | ICD-10-CM | POA: Diagnosis present

## 2023-04-08 DIAGNOSIS — K668 Other specified disorders of peritoneum: Principal | ICD-10-CM | POA: Diagnosis present

## 2023-04-08 HISTORY — PX: XI ROBOTIC ASSISTED SMALL BOWEL RESECTION: SHX6872

## 2023-04-08 LAB — CBC
HCT: 49 % (ref 39.0–52.0)
Hemoglobin: 16.1 g/dL (ref 13.0–17.0)
MCH: 28.7 pg (ref 26.0–34.0)
MCHC: 32.9 g/dL (ref 30.0–36.0)
MCV: 87.3 fL (ref 80.0–100.0)
Platelets: 172 10*3/uL (ref 150–400)
RBC: 5.61 MIL/uL (ref 4.22–5.81)
RDW: 14 % (ref 11.5–15.5)
WBC: 20.5 10*3/uL — ABNORMAL HIGH (ref 4.0–10.5)
nRBC: 0 % (ref 0.0–0.2)

## 2023-04-08 LAB — TYPE AND SCREEN
ABO/RH(D): A POS
Antibody Screen: NEGATIVE

## 2023-04-08 LAB — MRSA NEXT GEN BY PCR, NASAL: MRSA by PCR Next Gen: NOT DETECTED

## 2023-04-08 LAB — CREATININE, SERUM
Creatinine, Ser: 1.29 mg/dL — ABNORMAL HIGH (ref 0.61–1.24)
GFR, Estimated: >60 mL/min (ref >60)

## 2023-04-08 LAB — ABO/RH: ABO/RH(D): A POS

## 2023-04-08 SURGERY — EXCISION, SMALL INTESTINE, ROBOT-ASSISTED
Anesthesia: General

## 2023-04-08 MED ORDER — DIPHENHYDRAMINE HCL 50 MG/ML IJ SOLN
INTRAMUSCULAR | Status: AC
Start: 2023-04-08 — End: 2023-04-08
  Filled 2023-04-08: qty 1

## 2023-04-08 MED ORDER — FAMOTIDINE 20 MG PO TABS
20.0000 mg | ORAL_TABLET | Freq: Every day | ORAL | Status: DC
  Administered 2023-04-09 – 2023-04-10 (×2): 20 mg via ORAL
  Filled 2023-04-08 (×2): qty 1

## 2023-04-08 MED ORDER — UMECLIDINIUM BROMIDE 62.5 MCG/ACT IN AEPB
1.0000 | INHALATION_SPRAY | Freq: Every day | RESPIRATORY_TRACT | Status: DC
  Administered 2023-04-09 – 2023-04-10 (×2): 1 via RESPIRATORY_TRACT
  Filled 2023-04-08: qty 7

## 2023-04-08 MED ORDER — FENTANYL CITRATE PF 50 MCG/ML IJ SOSY
PREFILLED_SYRINGE | INTRAMUSCULAR | Status: AC
  Filled 2023-04-08: qty 3

## 2023-04-08 MED ORDER — ACETAMINOPHEN 500 MG PO TABS
1000.0000 mg | ORAL_TABLET | ORAL | Status: AC
  Administered 2023-04-08: 1000 mg via ORAL
  Filled 2023-04-08: qty 2

## 2023-04-08 MED ORDER — OXYCODONE HCL 5 MG/5ML PO SOLN: 5.0000 mg | Freq: Once | ORAL | Status: DC | PRN

## 2023-04-08 MED ORDER — DEXMEDETOMIDINE HCL IN NACL 80 MCG/20ML IV SOLN
INTRAVENOUS | Status: DC | PRN
  Administered 2023-04-08 (×4): 8 ug via INTRAVENOUS
  Administered 2023-04-08 (×2): 20 ug via INTRAVENOUS
  Administered 2023-04-08 (×2): 8 ug via INTRAVENOUS

## 2023-04-08 MED ORDER — ROCURONIUM BROMIDE 10 MG/ML (PF) SYRINGE
PREFILLED_SYRINGE | INTRAVENOUS | Status: AC
  Filled 2023-04-08: qty 10

## 2023-04-08 MED ORDER — TRAMADOL HCL 50 MG PO TABS
50.0000 mg | ORAL_TABLET | Freq: Four times a day (QID) | ORAL | Status: DC | PRN
  Administered 2023-04-09: 50 mg via ORAL
  Filled 2023-04-08 (×2): qty 1

## 2023-04-08 MED ORDER — BUPIVACAINE-EPINEPHRINE 0.25% -1:200000 IJ SOLN
INTRAMUSCULAR | Status: DC | PRN
  Administered 2023-04-08: 20 mL

## 2023-04-08 MED ORDER — DEXTROSE-SODIUM CHLORIDE 5-0.45 % IV SOLN: INTRAVENOUS | Status: AC

## 2023-04-08 MED ORDER — ONDANSETRON HCL 4 MG/2ML IJ SOLN
4.0000 mg | Freq: Four times a day (QID) | INTRAMUSCULAR | Status: DC | PRN
  Administered 2023-04-08 – 2023-04-09 (×3): 4 mg via INTRAVENOUS
  Filled 2023-04-08 (×3): qty 2

## 2023-04-08 MED ORDER — CHLORHEXIDINE GLUCONATE CLOTH 2 % EX PADS: 6.0000 | MEDICATED_PAD | Freq: Once | CUTANEOUS | Status: DC

## 2023-04-08 MED ORDER — ROCURONIUM BROMIDE 100 MG/10ML IV SOLN
INTRAVENOUS | Status: DC | PRN
  Administered 2023-04-08: 70 mg via INTRAVENOUS
  Administered 2023-04-08: 10 mg via INTRAVENOUS

## 2023-04-08 MED ORDER — DEXAMETHASONE SODIUM PHOSPHATE 10 MG/ML IJ SOLN
INTRAMUSCULAR | Status: AC
  Filled 2023-04-08: qty 1

## 2023-04-08 MED ORDER — LIDOCAINE HCL (PF) 2 % IJ SOLN
INTRAMUSCULAR | Status: AC
  Filled 2023-04-08: qty 5

## 2023-04-08 MED ORDER — DEXMEDETOMIDINE HCL IN NACL 80 MCG/20ML IV SOLN
INTRAVENOUS | Status: AC
  Filled 2023-04-08: qty 20

## 2023-04-08 MED ORDER — ACETAMINOPHEN 500 MG PO TABS
1000.0000 mg | ORAL_TABLET | Freq: Four times a day (QID) | ORAL | Status: DC
  Administered 2023-04-08 – 2023-04-10 (×7): 1000 mg via ORAL
  Filled 2023-04-08 (×8): qty 2

## 2023-04-08 MED ORDER — PROPOFOL 10 MG/ML IV BOLUS
INTRAVENOUS | Status: DC | PRN
  Administered 2023-04-08: 200 mg via INTRAVENOUS

## 2023-04-08 MED ORDER — OXYCODONE HCL 5 MG PO TABS: 5.0000 mg | ORAL_TABLET | Freq: Once | ORAL | Status: DC | PRN

## 2023-04-08 MED ORDER — ENOXAPARIN SODIUM 40 MG/0.4ML IJ SOSY
40.0000 mg | PREFILLED_SYRINGE | INTRAMUSCULAR | Status: DC
  Administered 2023-04-09 – 2023-04-10 (×2): 40 mg via SUBCUTANEOUS
  Filled 2023-04-08 (×2): qty 0.4

## 2023-04-08 MED ORDER — DIPHENHYDRAMINE HCL 12.5 MG/5ML PO ELIX: 12.5000 mg | ORAL_SOLUTION | Freq: Four times a day (QID) | ORAL | Status: DC | PRN

## 2023-04-08 MED ORDER — DEXAMETHASONE SODIUM PHOSPHATE 10 MG/ML IJ SOLN
INTRAMUSCULAR | Status: DC | PRN
  Administered 2023-04-08: 10 mg via INTRAVENOUS

## 2023-04-08 MED ORDER — CHLORHEXIDINE GLUCONATE CLOTH 2 % EX PADS
6.0000 | MEDICATED_PAD | Freq: Every day | CUTANEOUS | Status: DC
  Administered 2023-04-08 – 2023-04-10 (×3): 6 via TOPICAL

## 2023-04-08 MED ORDER — LIDOCAINE HCL (CARDIAC) PF 100 MG/5ML IV SOSY
PREFILLED_SYRINGE | INTRAVENOUS | Status: DC | PRN
Start: 2023-04-08 — End: 2023-04-08
  Administered 2023-04-08: 100 mg via INTRAVENOUS

## 2023-04-08 MED ORDER — PROPOFOL 10 MG/ML IV BOLUS
INTRAVENOUS | Status: AC
  Filled 2023-04-08: qty 20

## 2023-04-08 MED ORDER — 0.9 % SODIUM CHLORIDE (POUR BTL) OPTIME
TOPICAL | Status: DC | PRN
Start: 2023-04-08 — End: 2023-04-08
  Administered 2023-04-08 (×3): 1000 mL

## 2023-04-08 MED ORDER — LACTATED RINGERS IV SOLN: INTRAVENOUS | Status: DC

## 2023-04-08 MED ORDER — FLUTICASONE FUROATE-VILANTEROL 100-25 MCG/ACT IN AEPB
1.0000 | INHALATION_SPRAY | Freq: Every day | RESPIRATORY_TRACT | Status: DC
  Administered 2023-04-09 – 2023-04-10 (×2): 1 via RESPIRATORY_TRACT
  Filled 2023-04-08 (×2): qty 28

## 2023-04-08 MED ORDER — FENTANYL CITRATE (PF) 250 MCG/5ML IJ SOLN
INTRAMUSCULAR | Status: AC
  Filled 2023-04-08: qty 5

## 2023-04-08 MED ORDER — METOPROLOL TARTRATE 25 MG PO TABS
25.0000 mg | ORAL_TABLET | Freq: Every day | ORAL | Status: DC
  Administered 2023-04-09 – 2023-04-10 (×2): 25 mg via ORAL
  Filled 2023-04-08 (×2): qty 1

## 2023-04-08 MED ORDER — DIPHENHYDRAMINE HCL 50 MG/ML IJ SOLN: 12.5000 mg | Freq: Four times a day (QID) | INTRAMUSCULAR | Status: DC | PRN

## 2023-04-08 MED ORDER — MIDAZOLAM HCL 5 MG/5ML IJ SOLN
INTRAMUSCULAR | Status: DC | PRN
  Administered 2023-04-08: 2 mg via INTRAVENOUS

## 2023-04-08 MED ORDER — SODIUM CHLORIDE 0.9 % IV SOLN: INTRAVENOUS | Status: DC | PRN

## 2023-04-08 MED ORDER — DIPHENHYDRAMINE HCL 50 MG/ML IJ SOLN
INTRAMUSCULAR | Status: DC | PRN
  Administered 2023-04-08: 25 mg via INTRAVENOUS

## 2023-04-08 MED ORDER — SUGAMMADEX SODIUM 200 MG/2ML IV SOLN
INTRAVENOUS | Status: AC
  Filled 2023-04-08: qty 2

## 2023-04-08 MED ORDER — ORAL CARE MOUTH RINSE: 15.0000 mL | OROMUCOSAL | Status: DC | PRN

## 2023-04-08 MED ORDER — PROCHLORPERAZINE MALEATE 10 MG PO TABS: 10.0000 mg | ORAL_TABLET | Freq: Four times a day (QID) | ORAL | Status: DC | PRN

## 2023-04-08 MED ORDER — MORPHINE SULFATE (PF) 2 MG/ML IV SOLN
2.0000 mg | INTRAVENOUS | Status: DC | PRN
  Administered 2023-04-08 (×2): 2 mg via INTRAVENOUS
  Filled 2023-04-08 (×3): qty 1

## 2023-04-08 MED ORDER — FLUTICASONE PROPIONATE 50 MCG/ACT NA SUSP
2.0000 | Freq: Every day | NASAL | Status: DC
  Administered 2023-04-09 – 2023-04-10 (×2): 2 via NASAL
  Filled 2023-04-08: qty 16

## 2023-04-08 MED ORDER — ONDANSETRON HCL 4 MG/2ML IJ SOLN
INTRAMUSCULAR | Status: DC | PRN
  Administered 2023-04-08: 4 mg via INTRAVENOUS

## 2023-04-08 MED ORDER — BUPIVACAINE-EPINEPHRINE (PF) 0.25% -1:200000 IJ SOLN
INTRAMUSCULAR | Status: AC
  Filled 2023-04-08: qty 30

## 2023-04-08 MED ORDER — DEXTROSE-SODIUM CHLORIDE 5-0.2 % IV SOLN: INTRAVENOUS | Status: DC

## 2023-04-08 MED ORDER — AMISULPRIDE (ANTIEMETIC) 5 MG/2ML IV SOLN: 10.0000 mg | Freq: Once | INTRAVENOUS | Status: DC | PRN

## 2023-04-08 MED ORDER — FENTANYL CITRATE PF 50 MCG/ML IJ SOSY
25.0000 ug | PREFILLED_SYRINGE | INTRAMUSCULAR | Status: DC | PRN
  Administered 2023-04-08 (×2): 50 ug via INTRAVENOUS

## 2023-04-08 MED ORDER — CHLORHEXIDINE GLUCONATE CLOTH 2 % EX PADS
6.0000 | MEDICATED_PAD | Freq: Once | CUTANEOUS | Status: DC
Start: 2023-04-08 — End: 2023-04-08

## 2023-04-08 MED ORDER — CHLORHEXIDINE GLUCONATE 0.12 % MT SOLN
15.0000 mL | Freq: Once | OROMUCOSAL | Status: AC
  Administered 2023-04-08: 15 mL via OROMUCOSAL

## 2023-04-08 MED ORDER — GENTAMICIN SULFATE 40 MG/ML IJ SOLN
5.0000 mg/kg | Freq: Once | INTRAVENOUS | Status: AC
  Administered 2023-04-08: 544.4 mg via INTRAVENOUS
  Filled 2023-04-08: qty 13.5

## 2023-04-08 MED ORDER — MIDAZOLAM HCL 2 MG/2ML IJ SOLN
INTRAMUSCULAR | Status: AC
  Filled 2023-04-08: qty 2

## 2023-04-08 MED ORDER — ORAL CARE MOUTH RINSE: 15.0000 mL | Freq: Once | OROMUCOSAL | Status: AC

## 2023-04-08 MED ORDER — PROCHLORPERAZINE EDISYLATE 10 MG/2ML IJ SOLN: 5.0000 mg | Freq: Four times a day (QID) | INTRAMUSCULAR | Status: DC | PRN

## 2023-04-08 MED ORDER — SUGAMMADEX SODIUM 200 MG/2ML IV SOLN
INTRAVENOUS | Status: DC | PRN
  Administered 2023-04-08: 300 mg via INTRAVENOUS

## 2023-04-08 MED ORDER — ENSURE PRE-SURGERY PO LIQD: 296.0000 mL | Freq: Once | ORAL | Status: DC

## 2023-04-08 MED ORDER — HALOPERIDOL LACTATE 5 MG/ML IJ SOLN
INTRAMUSCULAR | Status: DC | PRN
  Administered 2023-04-08: 5 mg via INTRAVENOUS

## 2023-04-08 MED ORDER — ACETAMINOPHEN 10 MG/ML IV SOLN
INTRAVENOUS | Status: AC
  Filled 2023-04-08: qty 100

## 2023-04-08 MED ORDER — METOPROLOL TARTRATE 5 MG/5ML IV SOLN: 5.0000 mg | Freq: Four times a day (QID) | INTRAVENOUS | Status: DC | PRN

## 2023-04-08 MED ORDER — ONDANSETRON HCL 4 MG/2ML IJ SOLN
INTRAMUSCULAR | Status: AC
  Filled 2023-04-08: qty 2

## 2023-04-08 MED ORDER — AMLODIPINE BESYLATE 5 MG PO TABS
5.0000 mg | ORAL_TABLET | Freq: Two times a day (BID) | ORAL | Status: DC
  Administered 2023-04-08 – 2023-04-10 (×4): 5 mg via ORAL
  Filled 2023-04-08 (×4): qty 1

## 2023-04-08 MED ORDER — HALOPERIDOL LACTATE 5 MG/ML IJ SOLN
INTRAMUSCULAR | Status: AC
Start: 2023-04-08 — End: 2023-04-08
  Filled 2023-04-08: qty 1

## 2023-04-08 MED ORDER — ALBUMIN HUMAN 5 % IV SOLN: INTRAVENOUS | Status: DC | PRN

## 2023-04-08 MED ORDER — OXYCODONE HCL 5 MG PO TABS
5.0000 mg | ORAL_TABLET | ORAL | Status: DC | PRN
  Administered 2023-04-08: 5 mg via ORAL
  Administered 2023-04-09 (×2): 10 mg via ORAL
  Filled 2023-04-08: qty 2
  Filled 2023-04-08: qty 1
  Filled 2023-04-08 (×2): qty 2
  Filled 2023-04-08: qty 1

## 2023-04-08 MED ORDER — ALBUTEROL SULFATE (2.5 MG/3ML) 0.083% IN NEBU: 2.5000 mL | INHALATION_SOLUTION | RESPIRATORY_TRACT | Status: DC | PRN

## 2023-04-08 MED ORDER — HYDROMORPHONE HCL 2 MG/ML IJ SOLN
INTRAMUSCULAR | Status: AC
  Filled 2023-04-08: qty 1

## 2023-04-08 MED ORDER — FENTANYL CITRATE (PF) 100 MCG/2ML IJ SOLN
INTRAMUSCULAR | Status: DC | PRN
  Administered 2023-04-08: 100 ug via INTRAVENOUS
  Administered 2023-04-08 (×3): 50 ug via INTRAVENOUS

## 2023-04-08 MED ORDER — CLINDAMYCIN PHOSPHATE 900 MG/50ML IV SOLN
900.0000 mg | Freq: Once | INTRAVENOUS | Status: AC
  Administered 2023-04-08: 900 mg via INTRAVENOUS
  Filled 2023-04-08: qty 50

## 2023-04-08 MED ORDER — ONDANSETRON 4 MG PO TBDP
4.0000 mg | ORAL_TABLET | Freq: Four times a day (QID) | ORAL | Status: DC | PRN
Start: 2023-04-08 — End: 2023-04-10

## 2023-04-08 SURGICAL SUPPLY — 83 items
ANTIFOG SOL W/FOAM PAD STRL (MISCELLANEOUS) ×1 IMPLANT
APPLIER CLIP 5 13 M/L LIGAMAX5 (MISCELLANEOUS) IMPLANT
APPLIER CLIP ROT 10 11.4 M/L (STAPLE) IMPLANT
BAG COUNTER SPONGE SURGICOUNT (BAG) ×1 IMPLANT
BAG LAPAROSCOPIC 12 15 PORT 16 (BASKET) IMPLANT
BAG RETRIEVAL 12/15 (BASKET) IMPLANT
BENZOIN TINCTURE PRP APPL 2/3 (GAUZE/BANDAGES/DRESSINGS) ×1 IMPLANT
BLADE SURG SZ10 CARB STEEL (BLADE) IMPLANT
BLADE SURG SZ11 CARB STEEL (BLADE) ×1 IMPLANT
BNDG ADH 1X3 SHEER STRL LF (GAUZE/BANDAGES/DRESSINGS) ×6 IMPLANT
CANNULA REDUCER 12-8 DVNC XI (CANNULA) ×1 IMPLANT
CELLS DAT CNTRL 66122 CELL SVR (MISCELLANEOUS) ×1 IMPLANT
CHLORAPREP W/TINT 26 (MISCELLANEOUS) ×1 IMPLANT
CLIP APPLIE 5 13 M/L LIGAMAX5 (MISCELLANEOUS) IMPLANT
CLIP APPLIE ROT 10 11.4 M/L (STAPLE) IMPLANT
COVER SURGICAL LIGHT HANDLE (MISCELLANEOUS) ×1 IMPLANT
DERMABOND ADVANCED .7 DNX12 (GAUZE/BANDAGES/DRESSINGS) IMPLANT
DRAIN CHANNEL 19F RND (DRAIN) IMPLANT
DRAPE ARM DVNC X/XI (DISPOSABLE) ×4 IMPLANT
DRAPE COLUMN DVNC XI (DISPOSABLE) ×1 IMPLANT
DRAPE SURG ORHT 6 SPLT 77X108 (DRAPES) ×1 IMPLANT
DRIVER NDL LRG 8 DVNC XI (INSTRUMENTS) ×1 IMPLANT
DRIVER NDL MEGA SUTCUT DVNCXI (INSTRUMENTS) ×2 IMPLANT
DRIVER NDLE LRG 8 DVNC XI (INSTRUMENTS) ×1 IMPLANT
DRIVER NDLE MEGA SUTCUT DVNCXI (INSTRUMENTS) ×2 IMPLANT
DRSG OPSITE POSTOP 4X6 (GAUZE/BANDAGES/DRESSINGS) IMPLANT
DRSG TEGADERM 2-3/8X2-3/4 SM (GAUZE/BANDAGES/DRESSINGS) IMPLANT
DRSG TEGADERM 4X4.5 CHG (GAUZE/BANDAGES/DRESSINGS) IMPLANT
ELECT REM PT RETURN 15FT ADLT (MISCELLANEOUS) ×1 IMPLANT
EVACUATOR SILICONE 100CC (DRAIN) IMPLANT
FORCEPS CADIERE DVNC XI (FORCEP) ×1 IMPLANT
GAUZE 4X4 16PLY ~~LOC~~+RFID DBL (SPONGE) ×1 IMPLANT
GAUZE SPONGE 2X2 8PLY STRL LF (GAUZE/BANDAGES/DRESSINGS) IMPLANT
GLOVE BIOGEL PI IND STRL 7.0 (GLOVE) ×2 IMPLANT
GLOVE SURG SS PI 7.0 STRL IVOR (GLOVE) ×2 IMPLANT
GOWN STRL REUS W/ TWL LRG LVL3 (GOWN DISPOSABLE) ×2 IMPLANT
GOWN STRL REUS W/ TWL XL LVL3 (GOWN DISPOSABLE) IMPLANT
GRASPER SUT TROCAR 14GX15 (MISCELLANEOUS) ×1 IMPLANT
GRASPER TIP-UP FEN DVNC XI (INSTRUMENTS) ×1 IMPLANT
IRRIG SUCT STRYKERFLOW 2 WTIP (MISCELLANEOUS) ×1 IMPLANT
IRRIGATION SUCT STRKRFLW 2 WTP (MISCELLANEOUS) ×1 IMPLANT
KIT BASIN OR (CUSTOM PROCEDURE TRAY) ×1 IMPLANT
KIT TURNOVER KIT A (KITS) IMPLANT
LIGASURE IMPACT 36 18CM CVD LR (INSTRUMENTS) IMPLANT
MARKER SKIN DUAL TIP RULER LAB (MISCELLANEOUS) ×1 IMPLANT
MAT PREVALON FULL STRYKER (MISCELLANEOUS) ×1 IMPLANT
NDL SPNL 22GX3.5 QUINCKE BK (NEEDLE) ×1 IMPLANT
NEEDLE SPNL 22GX3.5 QUINCKE BK (NEEDLE) ×1 IMPLANT
PACK CARDIOVASCULAR III (CUSTOM PROCEDURE TRAY) ×1 IMPLANT
POUCH RETRIEVAL ECOSAC 10 (ENDOMECHANICALS) IMPLANT
RELOAD STAPLE 60 2.5 WHT DVNC (STAPLE) ×1 IMPLANT
RELOAD STAPLE 60 3.5 BLU DVNC (STAPLE) ×1 IMPLANT
RELOAD STAPLE 60 4.3 GRN DVNC (STAPLE) IMPLANT
RELOAD STAPLER 2.5X60 WHT DVNC (STAPLE) ×1 IMPLANT
RELOAD STAPLER 3.5X60 BLU DVNC (STAPLE) ×1 IMPLANT
RELOAD STAPLER 4.3X60 GRN DVNC (STAPLE) IMPLANT
RETRACTOR WND ALEXIS 18 MED (MISCELLANEOUS) IMPLANT
RTRCTR WOUND ALEXIS 18CM MED (MISCELLANEOUS) ×1 IMPLANT
SCISSORS LAP 5X35 DISP (ENDOMECHANICALS) IMPLANT
SCISSORS MNPLR CVD DVNC XI (INSTRUMENTS) ×1 IMPLANT
SEAL UNIV 5-12 XI (MISCELLANEOUS) ×4 IMPLANT
SEALER VESSEL EXT DVNC XI (MISCELLANEOUS) ×1 IMPLANT
SET TUBE SMOKE EVAC HIGH FLOW (TUBING) ×1 IMPLANT
SLEEVE GASTRECTOMY 40FR VISIGI (MISCELLANEOUS) ×1 IMPLANT
SOL ELECTROSURG ANTI STICK (MISCELLANEOUS) ×1 IMPLANT
SOLUTION ANTFG W/FOAM PAD STRL (MISCELLANEOUS) ×1 IMPLANT
SOLUTION ELECTROSURG ANTI STCK (MISCELLANEOUS) ×1 IMPLANT
SPIKE FLUID TRANSFER (MISCELLANEOUS) ×1 IMPLANT
STAPLER 60 SUREFORM DVNC (STAPLE) ×1 IMPLANT
STAPLER RELOAD 2.5X60 WHT DVNC (STAPLE) ×1 IMPLANT
STAPLER RELOAD 3.5X60 BLU DVNC (STAPLE) ×1 IMPLANT
STAPLER RELOAD 4.3X60 GRN DVNC (STAPLE) IMPLANT
STRIP CLOSURE SKIN 1/2X4 (GAUZE/BANDAGES/DRESSINGS) ×1 IMPLANT
SUT ETHIBOND 0 36 GRN (SUTURE) IMPLANT
SUT ETHILON 3 0 PS 1 (SUTURE) IMPLANT
SUT MNCRL AB 4-0 PS2 18 (SUTURE) ×1 IMPLANT
SUT PDS AB 0 CT1 36 (SUTURE) IMPLANT
SUT VIC AB 2-0 SH 27X BRD (SUTURE) IMPLANT
SUT VICRYL 0 TIES 12 18 (SUTURE) ×1 IMPLANT
SYR 20ML LL LF (SYRINGE) ×2 IMPLANT
TOWEL OR 17X26 10 PK STRL BLUE (TOWEL DISPOSABLE) ×1 IMPLANT
TRAY FOLEY MTR SLVR 16FR STAT (SET/KITS/TRAYS/PACK) IMPLANT
TROCAR Z-THREAD OPTICAL 5X100M (TROCAR) ×1 IMPLANT

## 2023-04-08 NOTE — Anesthesia Procedure Notes (Signed)
 Procedure Name: Intubation Date/Time: 04/08/2023 11:00 AM  Performed by: Sharyn Dross, CRNAPre-anesthesia Checklist: Patient identified, Emergency Drugs available, Suction available and Patient being monitored Patient Re-evaluated:Patient Re-evaluated prior to induction Oxygen Delivery Method: Circle system utilized Preoxygenation: Pre-oxygenation with 100% oxygen Induction Type: IV induction Ventilation: Mask ventilation without difficulty and Oral airway inserted - appropriate to patient size Laryngoscope Size: Mac and 4 Grade View: Grade I Tube type: Oral Tube size: 7.5 mm Number of attempts: 1 Airway Equipment and Method: Stylet and Oral airway Placement Confirmation: ETT inserted through vocal cords under direct vision, positive ETCO2 and breath sounds checked- equal and bilateral Secured at: 23 cm Tube secured with: Tape Dental Injury: Teeth and Oropharynx as per pre-operative assessment

## 2023-04-08 NOTE — Progress Notes (Signed)
 Patient combative in PACU, requiring additional medications. I was called about concern of abdominal distension. No tautness or hemodynamic instability, drain with serosanguinous output. Due to anxiety/med requirement, will move to step down

## 2023-04-08 NOTE — Anesthesia Postprocedure Evaluation (Signed)
 Anesthesia Post Note  Patient: Harry Riley  Procedure(s) Performed: OPEN EXCISSION OF A MESENTERIC MASS     Patient location during evaluation: PACU Anesthesia Type: General Level of consciousness: awake Pain management: pain level controlled Vital Signs Assessment: post-procedure vital signs reviewed and stable Respiratory status: spontaneous breathing, nonlabored ventilation and respiratory function stable Cardiovascular status: blood pressure returned to baseline and stable Postop Assessment: no apparent nausea or vomiting Anesthesia complication: emergence delerium.   There were no known notable events for this encounter.  Last Vitals:  Vitals:   04/08/23 1438 04/08/23 1445  BP:  112/63  Pulse: 79 78  Resp: 19 20  Temp:    SpO2: 93% 95%    Last Pain:  Vitals:   04/08/23 1334  TempSrc:   PainSc: 10-Worst pain ever                 Linton Rump

## 2023-04-08 NOTE — Op Note (Signed)
 Preoperative diagnosis: mesenteric mass  Postoperative diagnosis: same   Procedure: attempted robotic resection of mesenteric mass converted to open resection of mesenteric mass with enterrorhaphy  Surgeon: Feliciana Rossetti, M.D.  Asst: Gaynelle Adu, M.D.  Anesthesia: GETA  Indications for procedure: Harry Riley is a 68 y.o. year old male with symptoms of right sided abdominal pain. He had findings of a mesenteric mass. After discussion of options he decided to proceed with resection of the mass.  Description of procedure: The patient was brought into the operative suite. Anesthesia was administered with General endotracheal anesthesia. WHO checklist was applied. The patient was then placed in supine position. The area was prepped and draped in the usual sterile fashion.  Next, a right subcostal incision was made. A 5mm trocar was used to gain access to the peritoneal cavity by optical entry technique. Pneumoperitoneum was applied with a high flow and low pressure. The laparoscope was reinserted to confirm position.  3 Additional trocars were placed: 1 8 mm trocar in the right mid abdomen, 1 8 mm trocar in the right lower abdomen, 1 12 mm trocar in the suprapubic space.  Next the ligament of Treitz was identified and the small intestine again to be run from ligament of Treitz down.  During this there was a large brown cystlike mass identified lateral to the proximal jejunal mesentery.  Vessel sealer was attempted to free the mass from the mesentery.  It was difficult to fully mobilized and upon trying to mobilize it from behind it ruptured.  At this time I thought it best to open.  A epigastric vertical incision was made.  Cautery is used dissect down through subcutaneous tissues and fascia was entered in the midline.  Wound protector was put in place.  Fluid contents of the cystic structure were removed with suction.  The contents appeared to be feculent.  On digital provide patient of the  masses did not appear to be directly connected to colon or small intestine.  Some blunt dissection and cautery were used to further free the mass away from the mesentery.  The mass appeared to lie on top of the mesentery rather than within.  There is no blunt dissection show this be the case.  There appeared to be no posterior connection to intestine as well.  A portion of the posterior mass connections were removed with LigaSure.  There was 1 mesenteric vein that was divided and 3-0 silk sutures were used to ligate the vessel.  Mass was removed.  Inspection of the area showed that it was quite closely related to proximal jejunum on the mesenteric side.  There is no definite hole.  There is 1 area of serosal tear likely from dissection which was repaired with 3-0 silk.  Next, the CRNA placed OG into the stomach which was confirmed with digital palpation.  And 500 mL of air were pushed into the tube.  In addition, the small intestine was milked in retrograde fashion.  No fluid or air bubbles were seen in the area of small intestine of concern.  1 additional 3-0 silk placed near the mesenteric border of this piece of intestine.  The abdomen was then irrigated.  19 [place] Blake drain was introduced through the right lateral abdominal incision and sutured in place with 3-0 nylon such that the tip was in the dissection cavity.  Fascia was then closed with 0 PDS in running fashion.  Staples were used for skin.  Patient awoke from anesthesia brought to PACU  in stable condition.  All counts were correct.  Findings: cystic mass in the mesentery of the proximal jejunum which appeared to contain feces  Specimen: mesenteric mass  Implant: 19 fr blake drain in right side with tip in dissection cavity   Blood loss: 50 ml  Local anesthesia:  30 ml marcaine   Complications: none  Feliciana Rossetti, M.D. General, Bariatric, & Minimally Invasive Surgery [facility]

## 2023-04-08 NOTE — Plan of Care (Signed)

## 2023-04-08 NOTE — Transfer of Care (Signed)
 Immediate Anesthesia Transfer of Care Note  Patient: Harry Riley  Procedure(s) Performed: OPEN EXCISSION OF A MESENTERIC MASS  Patient Location: PACU  Anesthesia Type:General  Level of Consciousness: drowsy and patient cooperative  Airway & Oxygen Therapy: Patient connected to face mask oxygen  Post-op Assessment: Report given to RN, Post -op Vital signs reviewed and stable, and Patient moving all extremities X 4  Post vital signs: Reviewed and stable  Last Vitals:  Vitals Value Taken Time  BP 135/89 04/08/23 1324  Temp    Pulse 89 04/08/23 1328  Resp 25 04/08/23 1328  SpO2 97 % 04/08/23 1328  Vitals shown include unfiled device data.  Last Pain:  Vitals:   04/08/23 0954  TempSrc:   PainSc: 0-No pain      Patients Stated Pain Goal: 5 (04/08/23 0948)  Complications: There were no known notable events for this encounter.

## 2023-04-08 NOTE — Progress Notes (Signed)
 1345- Pt combative and removing medical equipment. MD Freida Busman notified and bedside. See MAR.  1405- pt combative and removing medical equipment. MD Freida Busman notified and bedside. See MAR. Ankle Safety Devices applied due to agitation, combativeness, and removing medical equipment  1438- MD Kinsinger notified of agitation, see new orders and bed request placed for ICU/Stepdown. MD Kinsinger bedside and evaluated pt abdomen and incisions  1530- MD Freida Busman notified pt was combative, See [facility] for fentanyl administration due to pt complaints of pain, Per MD Freida Busman ok to transfer to 1239.

## 2023-04-08 NOTE — Anesthesia Preprocedure Evaluation (Addendum)
 Anesthesia Evaluation  Patient identified by MRN, date of birth, ID band Patient awake    Reviewed: Allergy & Precautions, NPO status , Patient's Chart, lab work & pertinent test results  History of Anesthesia Complications Negative for: history of anesthetic complications  Airway Mallampati: III  TM Distance: >3 FB Neck ROM: Full    Dental  (+) Edentulous Upper, Edentulous Lower, Dental Advisory Given   Pulmonary neg shortness of breath, neg sleep apnea, COPD (used inhaler this morning),  COPD inhaler, neg recent URI, former smoker   Pulmonary exam normal breath sounds clear to auscultation       Cardiovascular hypertension (amlodipine, metoprolol), Pt. on medications and Pt. on home beta blockers (-) angina + Past MI  (-) Cardiac Stents and (-) CABG + dysrhythmias (iRBBB)  Rhythm:Regular Rate:Normal  HLD  Normal stress test 04/18/2021   Neuro/Psych  PSYCHIATRIC DISORDERS Anxiety     negative neurological ROS     GI/Hepatic hiatal hernia,GERD  Medicated,,Hepatic steatosis   Endo/Other  negative endocrine ROS    Renal/GU negative Renal ROS     Musculoskeletal   Abdominal  (+) + obese  Peds  Hematology negative hematology ROS (+) Lab Results      Component                Value               Date                      WBC                      9.6                 04/03/2023                HGB                      16.6                04/03/2023                HCT                      48.9                04/03/2023                MCV                      85.9                04/03/2023                PLT                      147 (L)             04/03/2023              Anesthesia Other Findings   Reproductive/Obstetrics                             Anesthesia Physical Anesthesia Plan  ASA: 3  Anesthesia Plan: General   Post-op Pain Management: Ofirmev IV (intra-op)*   Induction:  Intravenous  PONV Risk Score and Plan: 3 and Ondansetron, Dexamethasone and Treatment  may vary due to age or medical condition  Airway Management Planned: Oral ETT  Additional Equipment:   Intra-op Plan:   Post-operative Plan: Extubation in OR  Informed Consent: I have reviewed the patients History and Physical, chart, labs and discussed the procedure including the risks, benefits and alternatives for the proposed anesthesia with the patient or authorized representative who has indicated his/her understanding and acceptance.     Dental advisory given  Plan Discussed with: CRNA and Anesthesiologist  Anesthesia Plan Comments: (Risks of general anesthesia discussed including, but not limited to, sore throat, hoarse voice, chipped/damaged teeth, injury to vocal cords, nausea and vomiting, allergic reactions, lung infection, heart attack, stroke, and death. All questions answered. )        Anesthesia Quick Evaluation

## 2023-04-08 NOTE — H&P (Signed)
 Subjective   Harry Riley is a 68 y.o. male new patient in today for: History of Present Illness The patient presents with a recently discovered abdominal mass. He initially sought medical attention due to pain on the right side of his abdomen. An ultrasound was performed, which revealed the mass. The patient reports that the pain has since resolved and denies any recent weight changes or difficulty eating. He has not had any previous abdominal surgeries or infections requiring antibiotics. The patient has never had a colonoscopy. He also mentions a small hernia, which the doctor identifies as likely being a diastasis.  Social Drivers of Health with Concerns   Tobacco Use: Medium Risk (03/20/2023)  Patient History  Smoking Tobacco Use: Former  Smokeless Tobacco Use: Never  Housing Stability: Unknown (03/20/2023)  Housing Stability Vital Sign  Homeless in the Last Year: No    No data to display      Outpatient Medications Prior to Visit  Medication Sig Dispense Refill  albuterol MDI, PROVENTIL, VENTOLIN, PROAIR, HFA 90 mcg/actuation inhaler INHALE 2 PUFFS BY MOUTH 4 TIMES A DAY AS NEEDED  amLODIPine (NORVASC) 5 MG tablet Take 5 mg by mouth 2 (two) times daily  famotidine (PEPCID) 20 MG tablet Take 20 mg by mouth once daily  metoprolol TARTrate (LOPRESSOR) 25 MG tablet Take 25 mg by mouth once daily  rosuvastatin (CRESTOR) 5 MG tablet  sertraline (ZOLOFT) 25 MG tablet Take 25 mg by mouth once daily  TRELEGY ELLIPTA 100-62.5-25 mcg inhaler Inhale 1 Puff into the lungs once daily  fluticasone propionate (FLONASE) 50 mcg/actuation nasal spray Place 2 sprays into one nostril once daily   No facility-administered medications prior to visit.   Review of Systems  Constitutional: Negative.  HENT: Negative.  Eyes: Negative.  Respiratory: Negative.  Cardiovascular: Negative.  Gastrointestinal: Negative.  Genitourinary: Negative.  Musculoskeletal: Negative.  Skin: Negative.   Neurological: Negative.  Endo/Heme/Allergies: Negative.  Psychiatric/Behavioral: Negative.    Objective   Vitals:  03/20/23 1014  BP: (!) 156/78  Pulse: 89  Temp: 36.9 C (98.4 F)  SpO2: 97%  Weight: (!) 105.3 kg (232 lb 3.2 oz)  Height: 177.8 cm (5\' 10" )  PainSc: 0-No pain   Body mass index is 33.32 kg/m. Physical Exam Constitutional:  Appearance: Normal appearance.  HENT:  Head: Normocephalic and atraumatic.  Pulmonary:  Effort: Pulmonary effort is normal.  Abdominal:  Comments: Moderate epigastric diastasis, no hernias, nontender, nondistended  Musculoskeletal:  General: Normal range of motion.  Cervical back: Normal range of motion.  Neurological:  General: No focal deficit present.  Mental Status: He is alert and oriented to person, place, and time. Mental status is at baseline.  Psychiatric:  Mood and Affect: Mood normal.  Behavior: Behavior normal.  Thought Content: Thought content normal.   I reviewed CT scan images showing a 7 cm cystic mass in the left upper quadrant SB mesentery  Assessment/Plan:   Assessment & Plan Abdominal mass Abdominal mass identified in the small intestine area, likely a cyst or fluid collection. Asymptomatic but requires removal for diagnosis. - Schedule surgical removal using robotic technology with small incisions. - Plan for potential intestinal resection and anastomosis, 30% likelihood. - Anticipate overnight hospital stay for pain control and monitoring. - Discuss risks: infection, bleeding, larger incision possibility. - Discuss anesthesia process, low risk of complications.  Stress-induced chest pain History of stress-induced chest pain. No anticoagulants. Low risk of anesthesia-related cardiac complications acknowledged.  Chronic obstructive pulmonary disease (COPD) COPD under regular management. Low  risk of anesthesia-related respiratory complications acknowledged.

## 2023-04-08 NOTE — Progress Notes (Addendum)
 Pt report given to Danielle Rankin RN; pt transported via bed to room 1315 on [address].  Unable to reach wife Hattie.  Pt had told wife about the pending room/unit change, but did not know what room/unit.  Per pt, pt wanted this RN to leave a voicemail with current room and unit, despite generic outgoing voice message - pt said that the number in chart was correct and that was her voicemail.  Message left stating that her husband moved to the general surgery floor, with unit/room number as well as the unit phone number.  All belongings transported with pt.

## 2023-04-09 ENCOUNTER — Encounter (HOSPITAL_COMMUNITY): Payer: Self-pay | Admitting: General Surgery

## 2023-04-09 LAB — CBC
HCT: 48.1 % (ref 39.0–52.0)
Hemoglobin: 15.9 g/dL (ref 13.0–17.0)
MCH: 28.8 pg (ref 26.0–34.0)
MCHC: 33.1 g/dL (ref 30.0–36.0)
MCV: 87.1 fL (ref 80.0–100.0)
Platelets: 170 10*3/uL (ref 150–400)
RBC: 5.52 MIL/uL (ref 4.22–5.81)
RDW: 13.9 % (ref 11.5–15.5)
WBC: 15.5 10*3/uL — ABNORMAL HIGH (ref 4.0–10.5)
nRBC: 0 % (ref 0.0–0.2)

## 2023-04-09 LAB — SURGICAL PATHOLOGY

## 2023-04-09 LAB — BASIC METABOLIC PANEL
Anion gap: 8 (ref 5–15)
BUN: 13 mg/dL (ref 8–23)
CO2: 19 mmol/L — ABNORMAL LOW (ref 22–32)
Calcium: 8.7 mg/dL — ABNORMAL LOW (ref 8.9–10.3)
Chloride: 109 mmol/L (ref 98–111)
Creatinine, Ser: 1.18 mg/dL (ref 0.61–1.24)
GFR, Estimated: >60 mL/min (ref >60)
Glucose, Bld: 126 mg/dL — ABNORMAL HIGH (ref 70–99)
Potassium: 3.9 mmol/L (ref 3.5–5.1)
Sodium: 136 mmol/L (ref 135–145)

## 2023-04-09 NOTE — Progress Notes (Signed)
  1 Day Post-Op   Chief Complaint/Subjective: Had difficult wake up but calmed down after arriving to 2[address]. Transferred to floor overnight. Tolerating liquids, +flatus  Objective: Vital signs in last 24 hours: Temp:  [97.6 F (36.4 C)-98.6 F (37 C)] 97.9 F (36.6 C) (03/25 0614) Pulse Rate:  [73-95] 92 (03/25 0614) Resp:  [17-26] 18 (03/25 0614) BP: (68-154)/(52-89) 130/76 (03/25 0614) SpO2:  [88 %-100 %] 96 % (03/25 0614) Weight:  [105.4 kg-108.9 kg] 105.4 kg (03/24 1626) Last BM Date :  (PTA) Intake/Output from previous day: 03/24 0701 - 03/25 0700 In: 2626.6 [P.O.:480; I.V.:1732.1; IV Piggyback:414.5] Out: 2380 [Urine:2075; Drains:255; Blood:50]  PE: Gen: NAD Resp: nonlabored Card: RRR Abd: soft, incisions c/d/i  Lab Results:  Recent Labs    04/08/23 1748 04/09/23 0514  WBC 20.5* 15.5*  HGB 16.1 15.9  HCT 49.0 48.1  PLT 172 170   Recent Labs    04/08/23 1748 04/09/23 0514  NA  --  136  K  --  3.9  CL  --  109  CO2  --  19*  GLUCOSE  --  126*  BUN  --  13  CREATININE 1.29* 1.18  CALCIUM  --  8.7*   No results for input(s): "LABPROT", "INR" in the last 72 hours.    Component Value Date/Time   NA 136 04/09/2023 0514   K 3.9 04/09/2023 0514   CL 109 04/09/2023 0514   CO2 19 (L) 04/09/2023 0514   GLUCOSE 126 (H) 04/09/2023 0514   BUN 13 04/09/2023 0514   CREATININE 1.18 04/09/2023 0514   CREATININE 0.97 04/04/2020 1059   CALCIUM 8.7 (L) 04/09/2023 0514   PROT 7.2 03/07/2022 1918   ALBUMIN 4.3 03/07/2022 1918   AST 48 (H) 03/07/2022 1918   ALT 62 (H) 03/07/2022 1918   ALKPHOS 87 03/07/2022 1918   BILITOT 0.8 03/07/2022 1918   GFRNONAA >60 04/09/2023 0514   GFRNONAA 82 04/04/2020 1059   GFRAA 95 04/04/2020 1059    Assessment/Plan  s/p Procedure(s): OPEN EXCISSION OF A MESENTERIC MASS 04/08/2023    FEN - reg diet VTE - lovenox ID - periop abx Disposition - inpatient, advancing diet   LOS: 1 day   I reviewed last 24 h vitals and pain  scores, last 48 h intake and output, last 24 h labs and trends, and last 24 h imaging results.  This care required moderate level of medical decision making.   De Blanch [facility] 04/09/2023, 7:38 AM Please see Amion for pager number during day hours 7:00am-4:30pm or 7:00am -11:30am on weekends

## 2023-04-09 NOTE — Plan of Care (Signed)
  Problem: Clinical Measurements: Goal: Ability to maintain clinical measurements within normal limits will improve Outcome: Progressing Goal: Respiratory complications will improve Outcome: Progressing   Problem: Activity: Goal: Risk for activity intolerance will decrease Outcome: Progressing   Problem: Nutrition: Goal: Adequate nutrition will be maintained Outcome: Progressing   Problem: Coping: Goal: Level of anxiety will decrease Outcome: Progressing   

## 2023-04-09 NOTE — Progress Notes (Signed)
   04/09/23 1155  TOC Brief Assessment  Insurance and Status Reviewed  Patient has primary care physician Yes  Home environment has been reviewed resides in private residence with spouse  Prior level of function: Independent  Prior/Current Home Services No current home services  Social Drivers of Health Review SDOH reviewed no interventions necessary  Readmission risk has been reviewed Yes  Transition of care needs no transition of care needs at this time

## 2023-04-10 LAB — CBC
HCT: 46.9 % (ref 39.0–52.0)
Hemoglobin: 15.1 g/dL (ref 13.0–17.0)
MCH: 28.6 pg (ref 26.0–34.0)
MCHC: 32.2 g/dL (ref 30.0–36.0)
MCV: 88.8 fL (ref 80.0–100.0)
Platelets: 169 10*3/uL (ref 150–400)
RBC: 5.28 MIL/uL (ref 4.22–5.81)
RDW: 14.2 % (ref 11.5–15.5)
WBC: 14.9 10*3/uL — ABNORMAL HIGH (ref 4.0–10.5)
nRBC: 0 % (ref 0.0–0.2)

## 2023-04-10 LAB — BASIC METABOLIC PANEL
Anion gap: 13 (ref 5–15)
BUN: 17 mg/dL (ref 8–23)
CO2: 20 mmol/L — ABNORMAL LOW (ref 22–32)
Calcium: 8.5 mg/dL — ABNORMAL LOW (ref 8.9–10.3)
Chloride: 106 mmol/L (ref 98–111)
Creatinine, Ser: 1.14 mg/dL (ref 0.61–1.24)
GFR, Estimated: >60 mL/min (ref >60)
Glucose, Bld: 91 mg/dL (ref 70–99)
Potassium: 3.4 mmol/L — ABNORMAL LOW (ref 3.5–5.1)
Sodium: 139 mmol/L (ref 135–145)

## 2023-04-10 MED ORDER — IBUPROFEN 800 MG PO TABS: 800.0000 mg | ORAL_TABLET | Freq: Three times a day (TID) | ORAL | 0 refills | Status: AC | PRN

## 2023-04-10 MED ORDER — OXYCODONE HCL 5 MG PO TABS: 5.0000 mg | ORAL_TABLET | Freq: Four times a day (QID) | ORAL | 0 refills | Status: AC | PRN

## 2023-04-10 NOTE — Care Management Important Message (Signed)
 Important Message  Patient Details IM Letter given to the Patient Name: Harry Riley MRN: [id number] Date of Birth: 1955/07/05   Important Message Given:  Yes - Medicare IM     Caren Macadam 04/10/2023, 10:53 AM

## 2023-04-10 NOTE — Discharge Summary (Signed)
 Physician Discharge Summary  Harry Riley ZOX:[id number] DOB: April 18, 1955 DOA: 04/08/2023  PCP: [facility]  Admit date: 04/08/2023 Discharge date:  04/10/2023   Recommendations for Outpatient Follow-up:   (include homehealth, outpatient follow-up instructions, specific recommendations for PCP to follow-up on, etc.)   Discharge Diagnoses:  Principal Problem:   Mesenteric mass   Surgical Procedure: removal of mesenteric mass  Discharge Condition: Good Disposition: Home  Diet recommendation: reg diet   Hospital Course:  68 yo male presented for removal of cystic mass. Post op he had some anxiety waking up and was admitted to step down, he was transferred out and advanced diet. He was discharged home POD 2.  Discharge Instructions  Discharge Instructions     Diet - low sodium heart healthy   Complete by: As directed    Discharge wound care:   Complete by: As directed    Shower normal tomorrow. Bandage around drain. Empty drain daily   Increase activity slowly   Complete by: As directed       Allergies as of 04/10/2023       Reactions   Penicillins Anaphylaxis        Medication List     TAKE these medications    albuterol 108 (90 Base) MCG/ACT inhaler Commonly known as: VENTOLIN HFA INHALE 2 PUFFS BY MOUTH 4 TIMES A DAY AS NEEDED What changed: See the new instructions.   amLODipine 5 MG tablet Commonly known as: NORVASC Take 5 mg by mouth 2 (two) times daily.   dextromethorphan-guaiFENesin 30-600 MG 12hr tablet Commonly known as: MUCINEX DM Take 1 tablet by mouth 2 (two) times daily.   famotidine 20 MG tablet Commonly known as: PEPCID Take 20 mg by mouth daily.   fluticasone 50 MCG/ACT nasal spray Commonly known as: FLONASE Place 2 sprays into both nostrils daily.   ibuprofen 800 MG tablet Commonly known as: ADVIL Take 1 tablet (800 mg total) by mouth every 8 (eight) hours as needed.   metoprolol tartrate 25 MG tablet Commonly  known as: LOPRESSOR Take 25 mg by mouth daily.   oxyCODONE 5 MG immediate release tablet Commonly known as: Oxy IR/ROXICODONE Take 1 tablet (5 mg total) by mouth every 6 (six) hours as needed for severe pain (pain score 7-10).   rosuvastatin 5 MG tablet Commonly known as: CRESTOR Take 5 mg by mouth daily.   Trelegy Ellipta 100-62.5-25 MCG/ACT Aepb Generic drug: Fluticasone-Umeclidin-Vilant Inhale 1 puff into the lungs daily.               Discharge Care Instructions  (From admission, onward)           Start     Ordered   04/10/23 0000  Discharge wound care:       Comments: Shower normal tomorrow. Bandage around drain. Empty drain daily   04/10/23 0837              The results of significant diagnostics from this hospitalization (including imaging, microbiology, ancillary and laboratory) are listed below for reference.    Significant Diagnostic Studies: No results found.  Labs: Basic Metabolic Panel: Recent Labs  Lab 04/03/23 1122 04/08/23 1748 04/09/23 0514 04/10/23 0516  NA 136  --  136 139  K 3.3*  --  3.9 3.4*  CL 105  --  109 106  CO2 21*  --  19* 20*  GLUCOSE 88  --  126* 91  BUN 10  --  13 17  CREATININE 1.09 1.29*  1.18 1.14  CALCIUM 8.6*  --  8.7* 8.5*   Liver Function Tests: No results for input(s): "AST", "ALT", "ALKPHOS", "BILITOT", "PROT", "ALBUMIN" in the last 168 hours.  CBC: Recent Labs  Lab 04/03/23 1122 04/08/23 1748 04/09/23 0514  WBC 9.6 20.5* 15.5*  HGB 16.6 16.1 15.9  HCT 48.9 49.0 48.1  MCV 85.9 87.3 87.1  PLT 147* 172 170    CBG: No results for input(s): "GLUCAP" in the last 168 hours.  Principal Problem:   Mesenteric mass   Time coordinating discharge: 15 min

## 2023-04-10 NOTE — Progress Notes (Signed)
 Discharge instructions including JP Drain care given to patient and wife D Actuary

## 2023-04-14 DIAGNOSIS — I10 Essential (primary) hypertension: Secondary | ICD-10-CM | POA: Diagnosis not present

## 2023-06-20 DIAGNOSIS — C44319 Basal cell carcinoma of skin of other parts of face: Secondary | ICD-10-CM | POA: Diagnosis not present

## 2023-06-20 DIAGNOSIS — Z1283 Encounter for screening for malignant neoplasm of skin: Secondary | ICD-10-CM | POA: Diagnosis not present

## 2023-06-20 DIAGNOSIS — D225 Melanocytic nevi of trunk: Secondary | ICD-10-CM | POA: Diagnosis not present

## 2023-06-20 DIAGNOSIS — Z08 Encounter for follow-up examination after completed treatment for malignant neoplasm: Secondary | ICD-10-CM | POA: Diagnosis not present

## 2023-06-20 DIAGNOSIS — X32XXXD Exposure to sunlight, subsequent encounter: Secondary | ICD-10-CM | POA: Diagnosis not present

## 2023-06-20 DIAGNOSIS — C4441 Basal cell carcinoma of skin of scalp and neck: Secondary | ICD-10-CM | POA: Diagnosis not present

## 2023-06-20 DIAGNOSIS — L570 Actinic keratosis: Secondary | ICD-10-CM | POA: Diagnosis not present

## 2023-06-20 DIAGNOSIS — Z85820 Personal history of malignant melanoma of skin: Secondary | ICD-10-CM | POA: Diagnosis not present

## 2023-06-26 DIAGNOSIS — Z79899 Other long term (current) drug therapy: Secondary | ICD-10-CM | POA: Diagnosis not present

## 2023-06-26 DIAGNOSIS — Z299 Encounter for prophylactic measures, unspecified: Secondary | ICD-10-CM | POA: Diagnosis not present

## 2023-06-26 DIAGNOSIS — Z7189 Other specified counseling: Secondary | ICD-10-CM | POA: Diagnosis not present

## 2023-06-26 DIAGNOSIS — I10 Essential (primary) hypertension: Secondary | ICD-10-CM | POA: Diagnosis not present

## 2023-06-26 DIAGNOSIS — E7800 Pure hypercholesterolemia, unspecified: Secondary | ICD-10-CM | POA: Diagnosis not present

## 2023-06-26 DIAGNOSIS — R5383 Other fatigue: Secondary | ICD-10-CM | POA: Diagnosis not present

## 2023-06-26 DIAGNOSIS — Z0000 Encounter for general adult medical examination without abnormal findings: Secondary | ICD-10-CM | POA: Diagnosis not present

## 2023-07-12 DIAGNOSIS — J0100 Acute maxillary sinusitis, unspecified: Secondary | ICD-10-CM | POA: Diagnosis not present

## 2023-07-15 DIAGNOSIS — I10 Essential (primary) hypertension: Secondary | ICD-10-CM | POA: Diagnosis not present

## 2023-08-15 DIAGNOSIS — I10 Essential (primary) hypertension: Secondary | ICD-10-CM | POA: Diagnosis not present

## 2023-09-14 DIAGNOSIS — I10 Essential (primary) hypertension: Secondary | ICD-10-CM | POA: Diagnosis not present

## 2023-09-26 DIAGNOSIS — Z85820 Personal history of malignant melanoma of skin: Secondary | ICD-10-CM | POA: Diagnosis not present

## 2023-09-26 DIAGNOSIS — L218 Other seborrheic dermatitis: Secondary | ICD-10-CM | POA: Diagnosis not present

## 2023-09-26 DIAGNOSIS — Z85828 Personal history of other malignant neoplasm of skin: Secondary | ICD-10-CM | POA: Diagnosis not present

## 2023-09-26 DIAGNOSIS — Z08 Encounter for follow-up examination after completed treatment for malignant neoplasm: Secondary | ICD-10-CM | POA: Diagnosis not present

## 2023-10-15 DIAGNOSIS — I10 Essential (primary) hypertension: Secondary | ICD-10-CM | POA: Diagnosis not present
